# Patient Record
Sex: Female | Born: 1990 | Race: Black or African American | Hispanic: No | Marital: Married | Smoking: Former smoker
Health system: Southern US, Community
[De-identification: ages and names within clinical notes are randomized; demographics above are authoritative.]

## PROBLEM LIST (undated history)

## (undated) HISTORY — PX: OTHER SURGICAL HISTORY: SHX169

## (undated) HISTORY — PX: TONSILLECTOMY: SUR1361

---

## 2011-08-21 ENCOUNTER — Encounter (HOSPITAL_COMMUNITY): Payer: Self-pay | Admitting: Emergency Medicine

## 2011-08-21 ENCOUNTER — Emergency Department (HOSPITAL_COMMUNITY)
Admission: EM | Admit: 2011-08-21 | Discharge: 2011-08-21 | Disposition: A | Discharge status: Home / Self Care | Payer: Self-pay | Attending: Emergency Medicine | Admitting: Emergency Medicine

## 2011-08-21 DIAGNOSIS — Z87891 Personal history of nicotine dependence: Secondary | ICD-10-CM | POA: Insufficient documentation

## 2011-08-21 DIAGNOSIS — R197 Diarrhea, unspecified: Secondary | ICD-10-CM | POA: Insufficient documentation

## 2011-08-21 DIAGNOSIS — R112 Nausea with vomiting, unspecified: Secondary | ICD-10-CM | POA: Insufficient documentation

## 2011-08-21 LAB — COMPREHENSIVE METABOLIC PANEL
Alkaline Phosphatase: 46 U/L (ref 39–117)
BUN: 12 mg/dL (ref 6–23)
CO2: 23 mEq/L (ref 19–32)
Chloride: 99 mEq/L (ref 96–112)
GFR calc Af Amer: >90 mL/min (ref >90)
GFR calc non Af Amer: >90 mL/min (ref >90)
Glucose, Bld: 99 mg/dL (ref 70–99)
Potassium: 3.7 mEq/L (ref 3.5–5.1)
Total Bilirubin: 1.2 mg/dL (ref 0.3–1.2)
Total Protein: 8.6 g/dL — ABNORMAL HIGH (ref 6.0–8.3)

## 2011-08-21 LAB — URINALYSIS, ROUTINE W REFLEX MICROSCOPIC
Bilirubin Urine: NEGATIVE
Glucose, UA: NEGATIVE mg/dL
Ketones, ur: >80 mg/dL — AB
Leukocytes, UA: NEGATIVE
pH: 6 (ref 5.0–8.0)

## 2011-08-21 LAB — CBC WITH DIFFERENTIAL/PLATELET
Eosinophils Absolute: 0 10*3/uL (ref 0.0–0.7)
Hemoglobin: 13.8 g/dL (ref 12.0–15.0)
Lymphs Abs: 1.6 10*3/uL (ref 0.7–4.0)
MCH: 30.9 pg (ref 26.0–34.0)
MCV: 89 fL (ref 78.0–100.0)
Monocytes Relative: 4 % (ref 3–12)
Neutrophils Relative %: 87 % — ABNORMAL HIGH (ref 43–77)
RBC: 4.46 MIL/uL (ref 3.87–5.11)

## 2011-08-21 LAB — LIPASE, BLOOD: Lipase: 26 U/L (ref 11–59)

## 2011-08-21 MED ORDER — FAMOTIDINE IN NACL 20-0.9 MG/50ML-% IV SOLN
20.0000 mg | Freq: Once | INTRAVENOUS | Status: AC
Start: 2011-08-21 — End: 2011-08-21
  Administered 2011-08-21: 20 mg via INTRAVENOUS
  Filled 2011-08-21: qty 50

## 2011-08-21 MED ORDER — ONDANSETRON HCL 4 MG/2ML IJ SOLN
4.0000 mg | INTRAMUSCULAR | Status: DC | PRN
  Administered 2011-08-21: 4 mg via INTRAVENOUS
  Filled 2011-08-21: qty 2

## 2011-08-21 MED ORDER — DICYCLOMINE HCL 10 MG PO CAPS
20.0000 mg | ORAL_CAPSULE | Freq: Once | ORAL | Status: AC
  Administered 2011-08-21: 20 mg via ORAL
  Filled 2011-08-21: qty 2

## 2011-08-21 MED ORDER — SODIUM CHLORIDE 0.9 % IV SOLN: INTRAVENOUS | Status: DC

## 2011-08-21 MED ORDER — SODIUM CHLORIDE 0.9 % IV BOLUS (SEPSIS)
1000.0000 mL | Freq: Once | INTRAVENOUS | Status: AC
  Administered 2011-08-21: 1000 mL via INTRAVENOUS

## 2011-08-21 MED ORDER — ONDANSETRON HCL 4 MG PO TABS: 4.0000 mg | ORAL_TABLET | Freq: Three times a day (TID) | ORAL | Status: AC | PRN

## 2011-08-21 NOTE — Discharge Instructions (Signed)
RESOURCE GUIDE  Chronic Pain Problems: Contact [facility]  [phone] Patients need to be referred by their primary care doctor.  Insufficient Money for Medicine: Contact United Way:  call "211" or [facility] 9842676194.  No Primary Care Doctor: - Call [facility]  [phone] - can help you locate a primary care doctor that  accepts your insurance, provides certain services, etc. - Physician Referral Service- [phone]  Agencies that provide inexpensive medical care: - [facility]  [phone] - [facility]  [phone] - [facility]  [phone] - Women's Clinic  [phone] - Planned Parenthood  [phone] [facility]  [phone]  Medicaid-accepting [facility] Providers: - [facility]- [address]  [phone], Mon-Fri 9am-7pm, Sat 9am-1pm - [facility]- [address] [place], [address] [place]  [phone] - [facility]- [address] [place]  [phone] [facility]- [address]  [phone] - Renaye Rakers- [address] [place], [address], [phone]  Only accepts [place] patients after they have their name  applied to their card  Self Pay (no insurance) in [place]: - Sickle Cell Patients: Dr Willey Blade, [facility]  [address] [place], [phone] - [facility]- [address] [place]  [phone]       [facility] [place]- [address]       -     [facility]- see information above (Speak to [employer] if you do not have insurance)       -  [facility]- [address] [place], [phone]       -  [facility]- [address],  [phone]       -  [facility]- [address], [phone]       -  Dr Julio Sicks-  [address], [place], [phone]       -  [facility]- [address] [phone]       -  [facility]- [address] [phone], also [address] [phone]       -    [facility]- [address] [place], [phone], 1st & 3rd Saturday   every month, 10am-1pm  1) Find a Doctor and Pay Out of Pocket Although you won't have to find out who is covered by your insurance plan, it is a good idea to ask around and get recommendations. You will then need to call the office and see if the doctor you have chosen will accept you as a new patient and what types of options they offer for patients who are self-pay. Some doctors offer discounts or will set up payment plans for their patients who do not have insurance, but you will need to ask so you aren't surprised when you get to your appointment.  2) Contact Your Local Health Department Not all health departments have doctors that can see patients for sick visits, but many do, so it is worth a call to see if yours does. If you don't know where your local health department is, you can check in your phone book. The CDC also has a tool to help you locate your state's health department, and many state websites also have  listings of all of their local health departments.  3) Find a Walk-in Clinic If your illness is not likely to be very severe or complicated, you may want to try a walk in clinic. These are popping up all over the country in pharmacies, drugstores, and shopping centers. They're usually staffed by nurse practitioners or physician assistants that have been trained to treat common illnesses and complaints. They're usually fairly quick and inexpensive. However, if you have serious medical issues or chronic medical problems, these are probably not your best option  STD Testing - [facility] Department of [facility] [place], STD Clinic, [address] [place], phone [phone] or [phone].  Monday - Friday, call for an appointment. [facility]  Department of [employer], STD Clinic, [address], [place], phone [phone] or [phone].  Monday - Friday, call for an appointment.  Abuse/Neglect: [facility] Child Abuse Hotline 202 884 8764 [facility] Child Abuse Hotline 336-063-3643 (After Hours)  Emergency Shelter:  [facility] [phone]  Maternity Homes: - Room at the [place] [facility] [phone] - [facility] [phone]  MRSA Hotline #:   208-399-2520  [facility] Resources  [facility]  United Way [facility] Dept. [address]                 [address]         [address] [place] [address]  Blondell Reveal Phone:  [phone]                                  Phone:  [phone]                   Phone:  [phone]  [facility] Mental Health, [phone] - [facility] - [phone]       -     [facility] in [place], [address]                                  [phone], [facility] Child Abuse Hotline 930-629-5893 or 501-053-3444 (After Hours)   Behavioral Health Services  Substance Abuse Resources: - [facility]  [phone] - [facility] [phone] - [facility] [place] [phone] Floydene Flock [phone] - Residential & Outpatient Substance Abuse Program  8023185481  Psychological Services: [facility]  [phone] Services  [phone] - [facility], [phone] [place]. [address] [place], ACCESS LINE: (657) [phone] or [phone], [employer]  Dental Assistance  If unable to pay or uninsured, contact:  [facility] or [facility]. to become qualified for the adult dental  clinic.  Patients with Medicaid: [facility] [phone] [address], [phone] [address], [phone]  If unable  to pay, or uninsured, contact [facility] [phone]) or [facility] Department [phone] in [place], [phone] in [facility]) to become qualified for the adult dental clinic  Other Low-Cost Community Dental Services: - [facility]- [address] [place], [place], [place], [address], [phone], [phone], 2nd and 4th Thursday of the month at 6:30am.  10 clients each day by appointment, can sometimes see walk-in patients if someone does not show for an appointment. [facility]- [address] [place], [place], [address], [phone] - [facility]- [address], [place], [place], [address], [phone] [facility] Health Department- [phone] [facility] Health Department- [phone] [facility] [phone]     Take the prescription as directed.  Increase your fluid intake (ie:  Gatoraide) for the next few days, as discussed.  Eat a bland diet and advance to your regular diet slowly as you can tolerate it.   Avoid full strength juices, as well as milk and milk products until your diarrhea has resolved.   Call your regular medical doctor Monday to schedule a follow up appointment this week.  Return to the Emergency Department immediately if not improving (or even worsening) despite taking the medicines as prescribed, any black or bloody stool or vomit, if you develop a fever over "101," or for any other concerns.

## 2011-08-21 NOTE — ED Provider Notes (Signed)
History     CSN: [id number]  Arrival date & time 08/21/11  1539   First MD Initiated Contact with Patient 08/21/11 1614      Chief Complaint  Patient presents with  . Emesis  . Diarrhea    HPI Pt was seen at 1630.  Per pt, c/o gradual onset and persistence of multiple intermittent episodes of N/V/D since last night.  Describes the diarrhea as "watery."  Has been associated with generalized "cramping" abd pain.  Denies black or blood in stools or emesis, no back pain, no CP/SOB, no fevers, no rash.    History reviewed. No pertinent past medical history.  Past Surgical History  Procedure Date  . Tonsillectomy   . Addenoidectomy     History  Substance Use Topics  . Smoking status: Former Games developer  . Smokeless tobacco: Not on file  . Alcohol Use: No    Review of Systems ROS: Statement: All systems negative except as marked or noted in the HPI; Constitutional: Negative for fever and chills. ; ; Eyes: Negative for eye pain, redness and discharge. ; ; ENMT: Negative for ear pain, hoarseness, nasal congestion, sinus pressure and sore throat. ; ; Cardiovascular: Negative for chest pain, palpitations, diaphoresis, dyspnea and peripheral edema. ; ; Respiratory: Negative for cough, wheezing and stridor. ; ; Gastrointestinal: +N/V/D, abd "cramping." Negative for abdominal pain, blood in stool, hematemesis, jaundice and rectal bleeding. . ; ; Genitourinary: Negative for dysuria, flank pain and hematuria. ; ; Musculoskeletal: Negative for back pain and neck pain. Negative for swelling and trauma.; ; Skin: Negative for pruritus, rash, abrasions, blisters, bruising and skin lesion.; ; Neuro: Negative for headache, lightheadedness and neck stiffness. Negative for weakness, altered level of consciousness , altered mental status, extremity weakness, paresthesias, involuntary movement, seizure and syncope.       Allergies  Review of patient's allergies indicates no known allergies.  Home  Medications   Current Outpatient Rx  Name Route Sig Dispense Refill  . UNKNOWN TO PATIENT Oral Take 1 tablet by mouth daily. *Birth Control* (NAME UNKNOWN)      BP 133/70  Pulse 69  Temp 98.7 F (37.1 C) (Oral)  Resp 20  Ht 5\' 7"  (1.702 m)  Wt 170 lb (77.111 kg)  BMI 26.63 kg/m2  SpO2 100%  LMP 06/21/2011  Physical Exam 1635: Physical examination:  Nursing notes reviewed; Vital signs and O2 SAT reviewed;  Constitutional: Well developed, Well nourished, Well hydrated, In no acute distress; Head:  Normocephalic, atraumatic; Eyes: EOMI, PERRL, No scleral icterus; ENMT: Mouth and pharynx normal, Mucous membranes moist; Neck: Supple, Full range of motion, No lymphadenopathy; Cardiovascular: Regular rate and rhythm, No murmur, rub, or gallop; Respiratory: Breath sounds clear & equal bilaterally, No rales, rhonchi, wheezes.  Speaking full sentences with ease, Normal respiratory effort/excursion; Chest: Nontender, Movement normal; Abdomen: Soft, +mild mid-epigastric tenderness to palp.  No rebound or guarding. Nondistended, Normal bowel sounds;; Extremities: Pulses normal, No tenderness, No edema, No calf edema or asymmetry.; Neuro: AA&Ox3, Major CN grossly intact.  Speech clear. No gross focal motor or sensory deficits in extremities.; Skin: Color normal, Warm, Dry.   ED Course  Procedures   MDM  MDM Reviewed: nursing note and vitals Interpretation: labs     Results for orders placed during the hospital encounter of 08/21/11  URINALYSIS, ROUTINE W REFLEX MICROSCOPIC      Component Value Range   Color, Urine YELLOW  YELLOW   APPearance CLOUDY (*) CLEAR   Specific Gravity, Urine >  1.030 (*) 1.005 - 1.030   pH 6.0  5.0 - 8.0   Glucose, UA NEGATIVE  NEGATIVE mg/dL   Hgb urine dipstick TRACE (*) NEGATIVE   Bilirubin Urine NEGATIVE  NEGATIVE   Ketones, ur >80 (*) NEGATIVE mg/dL   Protein, ur 841 (*) NEGATIVE mg/dL   Urobilinogen, UA 0.2  0.0 - 1.0 mg/dL   Nitrite NEGATIVE  NEGATIVE    Leukocytes, UA NEGATIVE  NEGATIVE  PREGNANCY, URINE      Component Value Range   Preg Test, Ur NEGATIVE  NEGATIVE  CBC WITH DIFFERENTIAL      Component Value Range   WBC 17.1 (*) 4.0 - 10.5 K/uL   RBC 4.46  3.87 - 5.11 MIL/uL   Hemoglobin 13.8  12.0 - 15.0 g/dL   HCT 32.4  40.1 - 02.7 %   MCV 89.0  78.0 - 100.0 fL   MCH 30.9  26.0 - 34.0 pg   MCHC 34.8  30.0 - 36.0 g/dL   RDW 25.3  66.4 - 40.3 %   Platelets 287  150 - 400 K/uL   Neutrophils Relative 87 (*) 43 - 77 %   Neutro Abs 14.8 (*) 1.7 - 7.7 K/uL   Lymphocytes Relative 10 (*) 12 - 46 %   Lymphs Abs 1.6  0.7 - 4.0 K/uL   Monocytes Relative 4  3 - 12 %   Monocytes Absolute 0.7  0.1 - 1.0 K/uL   Eosinophils Relative 0  0 - 5 %   Eosinophils Absolute 0.0  0.0 - 0.7 K/uL   Basophils Relative 0  0 - 1 %   Basophils Absolute 0.0  0.0 - 0.1 K/uL  COMPREHENSIVE METABOLIC PANEL      Component Value Range   Sodium 137  135 - 145 mEq/L   Potassium 3.7  3.5 - 5.1 mEq/L   Chloride 99  96 - 112 mEq/L   CO2 23  19 - 32 mEq/L   Glucose, Bld 99  70 - 99 mg/dL   BUN 12  6 - 23 mg/dL   Creatinine, Ser 4.74  0.50 - 1.10 mg/dL   Calcium 25.9  8.4 - 56.3 mg/dL   Total Protein 8.6 (*) 6.0 - 8.3 g/dL   Albumin 5.0  3.5 - 5.2 g/dL   AST 37  0 - 37 U/L   ALT 33  0 - 35 U/L   Alkaline Phosphatase 46  39 - 117 U/L   Total Bilirubin 1.2  0.3 - 1.2 mg/dL   GFR calc non Af Amer >90  >90 mL/min   GFR calc Af Amer >90  >90 mL/min  LIPASE, BLOOD      Component Value Range   Lipase 26  11 - 59 U/L  URINE MICROSCOPIC-ADD ON      Component Value Range   WBC, UA 3-6  <3 WBC/hpf   RBC / HPF 3-6  <3 RBC/hpf   Bacteria, UA FEW (*) RARE   Casts GRANULAR CAST (*) NEGATIVE      1944:  Pt states she "feels better now" and wants to go home.  Has tol PO well while in the ED without N/V.  No stooling while in the ED.  VSS, afebrile, resps easy, abd benign.  Dx testing d/w pt.  Questions answered.  Verb understanding, agreeable to d/c home with outpt  f/u.       Laray Anger, DO 08/22/11 [phone]

## 2011-08-21 NOTE — ED Notes (Signed)
N/v/d since last night with all over abd. Last normal bm x 1 day. Mm wet. No s/s of dehydration. Denies gu sx's.

## 2011-08-22 ENCOUNTER — Emergency Department (HOSPITAL_COMMUNITY)
Admission: EM | Admit: 2011-08-22 | Discharge: 2011-08-22 | Disposition: A | Discharge status: Home / Self Care | Payer: Self-pay | Attending: Emergency Medicine | Admitting: Emergency Medicine

## 2011-08-22 ENCOUNTER — Emergency Department (HOSPITAL_COMMUNITY): Payer: Self-pay

## 2011-08-22 ENCOUNTER — Encounter (HOSPITAL_COMMUNITY): Payer: Self-pay | Admitting: Emergency Medicine

## 2011-08-22 DIAGNOSIS — R109 Unspecified abdominal pain: Secondary | ICD-10-CM | POA: Insufficient documentation

## 2011-08-22 DIAGNOSIS — R197 Diarrhea, unspecified: Secondary | ICD-10-CM | POA: Insufficient documentation

## 2011-08-22 DIAGNOSIS — R10819 Abdominal tenderness, unspecified site: Secondary | ICD-10-CM | POA: Insufficient documentation

## 2011-08-22 DIAGNOSIS — A499 Bacterial infection, unspecified: Secondary | ICD-10-CM | POA: Insufficient documentation

## 2011-08-22 DIAGNOSIS — N760 Acute vaginitis: Secondary | ICD-10-CM | POA: Insufficient documentation

## 2011-08-22 DIAGNOSIS — R112 Nausea with vomiting, unspecified: Secondary | ICD-10-CM | POA: Insufficient documentation

## 2011-08-22 DIAGNOSIS — B9689 Other specified bacterial agents as the cause of diseases classified elsewhere: Secondary | ICD-10-CM | POA: Insufficient documentation

## 2011-08-22 LAB — CBC WITH DIFFERENTIAL/PLATELET
Eosinophils Relative: 0 % (ref 0–5)
HCT: 40.7 % (ref 36.0–46.0)
Lymphocytes Relative: 16 % (ref 12–46)
Lymphs Abs: 2.5 10*3/uL (ref 0.7–4.0)
MCV: 89.1 fL (ref 78.0–100.0)
Monocytes Absolute: 0.8 10*3/uL (ref 0.1–1.0)
Neutro Abs: 12.1 10*3/uL — ABNORMAL HIGH (ref 1.7–7.7)
RBC: 4.57 MIL/uL (ref 3.87–5.11)
WBC: 15.4 10*3/uL — ABNORMAL HIGH (ref 4.0–10.5)

## 2011-08-22 LAB — BASIC METABOLIC PANEL
CO2: 21 mEq/L (ref 19–32)
Calcium: 10.2 mg/dL (ref 8.4–10.5)
Chloride: 102 mEq/L (ref 96–112)
Creatinine, Ser: 0.91 mg/dL (ref 0.50–1.10)
Glucose, Bld: 94 mg/dL (ref 70–99)
Sodium: 140 mEq/L (ref 135–145)

## 2011-08-22 LAB — OCCULT BLOOD, POC DEVICE: Fecal Occult Bld: NEGATIVE

## 2011-08-22 LAB — URINALYSIS, ROUTINE W REFLEX MICROSCOPIC
Glucose, UA: NEGATIVE mg/dL
Specific Gravity, Urine: >1.03 — ABNORMAL HIGH (ref 1.005–1.030)
pH: 6 (ref 5.0–8.0)

## 2011-08-22 LAB — PREGNANCY, URINE: Preg Test, Ur: NEGATIVE

## 2011-08-22 LAB — URINE MICROSCOPIC-ADD ON

## 2011-08-22 LAB — WET PREP, GENITAL: Yeast Wet Prep HPF POC: NONE SEEN

## 2011-08-22 MED ORDER — MORPHINE SULFATE 4 MG/ML IJ SOLN
4.0000 mg | Freq: Once | INTRAMUSCULAR | Status: AC
  Administered 2011-08-22: 4 mg via INTRAVENOUS
  Filled 2011-08-22: qty 1

## 2011-08-22 MED ORDER — KETOROLAC TROMETHAMINE 30 MG/ML IJ SOLN
30.0000 mg | Freq: Once | INTRAMUSCULAR | Status: AC
  Administered 2011-08-22: 30 mg via INTRAVENOUS
  Filled 2011-08-22: qty 1

## 2011-08-22 MED ORDER — ONDANSETRON HCL 4 MG/2ML IJ SOLN
INTRAMUSCULAR | Status: AC
  Administered 2011-08-22: 4 mg
  Filled 2011-08-22: qty 2

## 2011-08-22 MED ORDER — METRONIDAZOLE 500 MG PO TABS
2000.0000 mg | ORAL_TABLET | Freq: Once | ORAL | Status: AC
  Administered 2011-08-22: 2000 mg via ORAL
  Filled 2011-08-22: qty 4

## 2011-08-22 MED ORDER — IOHEXOL 300 MG/ML  SOLN
100.0000 mL | Freq: Once | INTRAMUSCULAR | Status: AC | PRN
  Administered 2011-08-22: 100 mL via INTRAVENOUS

## 2011-08-22 MED ORDER — SODIUM CHLORIDE 0.9 % IV BOLUS (SEPSIS)
1000.0000 mL | Freq: Once | INTRAVENOUS | Status: AC
  Administered 2011-08-22: 1000 mL via INTRAVENOUS

## 2011-08-22 MED ORDER — ONDANSETRON HCL 4 MG/2ML IJ SOLN
4.0000 mg | Freq: Once | INTRAMUSCULAR | Status: AC
  Administered 2011-08-22: 4 mg via INTRAVENOUS
  Filled 2011-08-22: qty 2

## 2011-08-22 NOTE — ED Notes (Signed)
Pt requesting more pain med. edp aware.  

## 2011-08-22 NOTE — ED Notes (Signed)
Pt resting better at this time.

## 2011-08-22 NOTE — ED Provider Notes (Signed)
History     CSN: [id number]  Arrival date & time 08/22/11  1546   First MD Initiated Contact with Patient 08/22/11 [phone]      Chief Complaint  Patient presents with  . Abdominal Pain    (Consider location/radiation/quality/duration/timing/severity/associated sxs/prior treatment) HPI Comments: Patient seen yesterday for nausea, vomiting and diarrhea. She returns today with increasing abdominal pain. She states she had 5-6 episodes of nonbloody nonbilious emesis today. She's had frequent loose stools as well that appeared to be black. She took Zofran today at 12:00 and then felt worse. Her pain is all over and generalized and crampy. Sick contacts, fevers, rash, back pain, chest pain or shortness of breath  The history is provided by the patient.    History reviewed. No pertinent past medical history.  Past Surgical History  Procedure Date  . Tonsillectomy   . Addenoidectomy     History reviewed. No pertinent family history.  History  Substance Use Topics  . Smoking status: Former Games developer  . Smokeless tobacco: Not on file  . Alcohol Use: No    OB History    Grav Para Term Preterm Abortions TAB SAB Ect Mult Living                  Review of Systems  Constitutional: Positive for appetite change. Negative for fever and fatigue.  HENT: Negative for congestion and rhinorrhea.   Respiratory: Negative for cough, chest tightness and shortness of breath.   Cardiovascular: Negative for chest pain.  Gastrointestinal: Positive for nausea, vomiting, abdominal pain and diarrhea.  Genitourinary: Negative for dysuria, hematuria, vaginal bleeding and vaginal discharge.  Musculoskeletal: Negative for back pain.  Skin: Negative for rash.  Neurological: Negative for headaches.    Allergies  Review of patient's allergies indicates no known allergies.  Home Medications   Current Outpatient Rx  Name Route Sig Dispense Refill  . ONDANSETRON HCL 4 MG PO TABS Oral Take 1 tablet (4 mg  total) by mouth every 8 (eight) hours as needed for nausea. 6 tablet 0    BP 117/68  Pulse 56  Temp 97.7 F (36.5 C) (Oral)  Resp 18  SpO2 100%  LMP 06/21/2011  Physical Exam  Constitutional: She is oriented to person, place, and time. She appears well-developed and well-nourished. No distress.       Uncomfortable  HENT:  Head: Normocephalic and atraumatic.  Mouth/Throat: Oropharynx is clear and moist. No oropharyngeal exudate.  Eyes: Conjunctivae and EOM are normal. Pupils are equal, round, and reactive to light.  Neck: Normal range of motion. Neck supple.  Cardiovascular: Normal rate, regular rhythm and normal heart sounds.   No murmur heard. Pulmonary/Chest: Effort normal and breath sounds normal. No respiratory distress.  Abdominal: Soft. There is tenderness. There is no rebound and no guarding.       Mild diffuse tenderness, no pain at McBurney's point no guarding or rebound  Genitourinary: There is no tenderness on the right labia. There is no tenderness on the left labia. Cervix exhibits no motion tenderness. Right adnexum displays no tenderness. Left adnexum displays no tenderness. No vaginal discharge found.  Musculoskeletal: Normal range of motion. She exhibits no edema and no tenderness.  Neurological: She is alert and oriented to person, place, and time. No cranial nerve deficit.  Skin: Skin is warm.    ED Course  Procedures (including critical care time)  Labs Reviewed  URINALYSIS, ROUTINE W REFLEX MICROSCOPIC - Abnormal; Notable for the following:    Color,  Urine AMBER (*)  BIOCHEMICALS MAY BE AFFECTED BY COLOR   Specific Gravity, Urine >1.030 (*)     Hgb urine dipstick TRACE (*)     Bilirubin Urine SMALL (*)     Ketones, ur >80 (*)     All other components within normal limits  CBC WITH DIFFERENTIAL - Abnormal; Notable for the following:    WBC 15.4 (*)     Neutrophils Relative 79 (*)     Neutro Abs 12.1 (*)     All other components within normal limits    BASIC METABOLIC PANEL - Abnormal; Notable for the following:    Potassium 3.3 (*)     All other components within normal limits  WET PREP, GENITAL - Abnormal; Notable for the following:    Clue Cells Wet Prep HPF POC FEW (*)     WBC, Wet Prep HPF POC FEW (*)     All other components within normal limits  URINE MICROSCOPIC-ADD ON - Abnormal; Notable for the following:    Squamous Epithelial / LPF MANY (*)     Bacteria, UA FEW (*)     All other components within normal limits  PREGNANCY, URINE  OCCULT BLOOD, POC DEVICE  GC/CHLAMYDIA PROBE AMP, GENITAL   Ct Abdomen Pelvis W Contrast  08/22/2011  *RADIOLOGY REPORT*  Clinical Data: Abdominal back pain, diarrhea, vomiting  CT ABDOMEN AND PELVIS WITH CONTRAST  Technique:  Multidetector CT imaging of the abdomen and pelvis was performed following the standard protocol during bolus administration of intravenous contrast.  Contrast: OMNIPAQUE IOHEXOL 300 MG/ML  SOLN  Comparison: None.  Findings: The lung bases are clear.  Heart size normal.  No pericardial or pleural effusion.  Negative for hiatal hernia.  Abdomen:  Liver, gallbladder, biliary system, pancreas, spleen, adrenal glands, kidneys demonstrate no acute process and are within normal limits for age.  Negative for bowel obstruction, dilatation, ileus, or free air.  Colon is collapsed.  No abdominal free fluid, fluid collection, hemorrhage, hematoma, or adenopathy.  Pelvis:  Bilateral adnexal ovarian cysts noted, measuring 2 x 3.3 cm on the right and 5 x 2.3 cm on the left.  Trace pelvic free fluid on the right, likely physiologic.  Normal retrocecal appendix demonstrated.  No pelvic fluid collection, hemorrhage, adenopathy, inguinal abnormality, or hernia.  No acute abnormal osseous finding.  IMPRESSION: No acute intra-abdominal or pelvic process.  Bilateral ovarian cysts  Normal appendix  Original Report Authenticated By: Judie Petit. Ruel Favors, M.D.     1. Nausea vomiting and diarrhea   2.  Bacterial vaginosis       MDM  Nausea, vomiting, diarrhea, abdominal pain, seen yesterday for same. Vital stable, no distress Abdomen soft and nonsurgical.  Urinalysis negative but positive for ketones. White count improved from yesterday. Pelvic exam benign. Pain improved with medications.  Patient pain improved medications. Abdomen is soft and nontender. We'll treat bacterial vaginosis.     Glynn Octave, MD 08/22/11 2126

## 2011-08-22 NOTE — ED Notes (Signed)
Pt went to bathroom for urine sample and vomited. edp aware

## 2011-08-22 NOTE — ED Notes (Signed)
Here yesterday for abd pain. Back today due to pain back again. Pain all over abd. Last normal bm yesterday. States had diarrhea x 1 today, states came out black but wiped brown. Vomiting x 5. Took zofran today at 52. Pt slightly restless.

## 2011-08-24 LAB — GC/CHLAMYDIA PROBE AMP, GENITAL: GC Probe Amp, Genital: NEGATIVE

## 2013-08-10 ENCOUNTER — Emergency Department (HOSPITAL_COMMUNITY): Payer: Self-pay

## 2013-08-10 ENCOUNTER — Encounter (HOSPITAL_COMMUNITY): Payer: Self-pay | Admitting: Emergency Medicine

## 2013-08-10 ENCOUNTER — Emergency Department (HOSPITAL_COMMUNITY)
Admission: EM | Admit: 2013-08-10 | Discharge: 2013-08-10 | Disposition: A | Discharge status: Home / Self Care | Payer: Self-pay | Attending: Emergency Medicine | Admitting: Emergency Medicine

## 2013-08-10 DIAGNOSIS — Z87891 Personal history of nicotine dependence: Secondary | ICD-10-CM | POA: Insufficient documentation

## 2013-08-10 DIAGNOSIS — Z3202 Encounter for pregnancy test, result negative: Secondary | ICD-10-CM | POA: Insufficient documentation

## 2013-08-10 DIAGNOSIS — N946 Dysmenorrhea, unspecified: Secondary | ICD-10-CM | POA: Insufficient documentation

## 2013-08-10 DIAGNOSIS — R109 Unspecified abdominal pain: Secondary | ICD-10-CM | POA: Insufficient documentation

## 2013-08-10 DIAGNOSIS — N760 Acute vaginitis: Secondary | ICD-10-CM | POA: Insufficient documentation

## 2013-08-10 DIAGNOSIS — B9689 Other specified bacterial agents as the cause of diseases classified elsewhere: Secondary | ICD-10-CM | POA: Insufficient documentation

## 2013-08-10 DIAGNOSIS — A499 Bacterial infection, unspecified: Secondary | ICD-10-CM | POA: Insufficient documentation

## 2013-08-10 DIAGNOSIS — R112 Nausea with vomiting, unspecified: Secondary | ICD-10-CM | POA: Insufficient documentation

## 2013-08-10 LAB — CBC WITH DIFFERENTIAL/PLATELET
BASOS ABS: 0 10*3/uL (ref 0.0–0.1)
BASOS PCT: 0 % (ref 0–1)
EOS ABS: 0 10*3/uL (ref 0.0–0.7)
Eosinophils Relative: 0 % (ref 0–5)
HCT: 38.7 % (ref 36.0–46.0)
HEMOGLOBIN: 13.1 g/dL (ref 12.0–15.0)
Lymphocytes Relative: 11 % — ABNORMAL LOW (ref 12–46)
Lymphs Abs: 2.1 10*3/uL (ref 0.7–4.0)
MCH: 30.5 pg (ref 26.0–34.0)
MCHC: 33.9 g/dL (ref 30.0–36.0)
MCV: 90 fL (ref 78.0–100.0)
MONOS PCT: 5 % (ref 3–12)
Monocytes Absolute: 1 10*3/uL (ref 0.1–1.0)
NEUTROS ABS: 16.4 10*3/uL — AB (ref 1.7–7.7)
NEUTROS PCT: 84 % — AB (ref 43–77)
PLATELETS: 340 10*3/uL (ref 150–400)
RBC: 4.3 MIL/uL (ref 3.87–5.11)
RDW: 12.8 % (ref 11.5–15.5)
WBC: 19.5 10*3/uL — ABNORMAL HIGH (ref 4.0–10.5)

## 2013-08-10 LAB — URINALYSIS, ROUTINE W REFLEX MICROSCOPIC
Bilirubin Urine: NEGATIVE
Glucose, UA: NEGATIVE mg/dL
KETONES UR: 40 mg/dL — AB
LEUKOCYTES UA: NEGATIVE
NITRITE: NEGATIVE
PROTEIN: 100 mg/dL — AB
Specific Gravity, Urine: 1.01 (ref 1.005–1.030)
UROBILINOGEN UA: 0.2 mg/dL (ref 0.0–1.0)
pH: >9 — ABNORMAL HIGH (ref 5.0–8.0)

## 2013-08-10 LAB — COMPREHENSIVE METABOLIC PANEL
ALBUMIN: 4.4 g/dL (ref 3.5–5.2)
ALK PHOS: 51 U/L (ref 39–117)
ALT: 19 U/L (ref 0–35)
ANION GAP: 16 — AB (ref 5–15)
AST: 25 U/L (ref 0–37)
BUN: 12 mg/dL (ref 6–23)
CO2: 22 mEq/L (ref 19–32)
Calcium: 9.6 mg/dL (ref 8.4–10.5)
Chloride: 101 mEq/L (ref 96–112)
Creatinine, Ser: 0.74 mg/dL (ref 0.50–1.10)
GFR calc Af Amer: >90 mL/min (ref >90)
GFR calc non Af Amer: >90 mL/min (ref >90)
Glucose, Bld: 117 mg/dL — ABNORMAL HIGH (ref 70–99)
POTASSIUM: 3.6 meq/L — AB (ref 3.7–5.3)
Sodium: 139 mEq/L (ref 137–147)
TOTAL PROTEIN: 7.9 g/dL (ref 6.0–8.3)
Total Bilirubin: 0.7 mg/dL (ref 0.3–1.2)

## 2013-08-10 LAB — WET PREP, GENITAL
Trich, Wet Prep: NONE SEEN
WBC, Wet Prep HPF POC: NONE SEEN
YEAST WET PREP: NONE SEEN

## 2013-08-10 LAB — PREGNANCY, URINE: PREG TEST UR: NEGATIVE

## 2013-08-10 LAB — URINE MICROSCOPIC-ADD ON

## 2013-08-10 LAB — LIPASE, BLOOD: LIPASE: 29 U/L (ref 11–59)

## 2013-08-10 MED ORDER — ONDANSETRON HCL 4 MG PO TABS: 4.0000 mg | ORAL_TABLET | Freq: Three times a day (TID) | ORAL | Status: DC | PRN

## 2013-08-10 MED ORDER — FAMOTIDINE 20 MG PO TABS
40.0000 mg | ORAL_TABLET | Freq: Once | ORAL | Status: AC
  Administered 2013-08-10: 40 mg via ORAL
  Filled 2013-08-10: qty 2

## 2013-08-10 MED ORDER — METRONIDAZOLE 500 MG PO TABS: 500.0000 mg | ORAL_TABLET | Freq: Two times a day (BID) | ORAL | Status: DC

## 2013-08-10 MED ORDER — NAPROXEN 250 MG PO TABS: 250.0000 mg | ORAL_TABLET | Freq: Two times a day (BID) | ORAL | Status: DC

## 2013-08-10 MED ORDER — ONDANSETRON 8 MG PO TBDP
8.0000 mg | ORAL_TABLET | Freq: Once | ORAL | Status: AC
  Administered 2013-08-10: 8 mg via ORAL
  Filled 2013-08-10: qty 1

## 2013-08-10 MED ORDER — OXYCODONE-ACETAMINOPHEN 5-325 MG PO TABS
1.0000 | ORAL_TABLET | Freq: Once | ORAL | Status: AC
  Administered 2013-08-10: 1 via ORAL
  Filled 2013-08-10: qty 1

## 2013-08-10 MED ORDER — KETOROLAC TROMETHAMINE 60 MG/2ML IM SOLN
60.0000 mg | Freq: Once | INTRAMUSCULAR | Status: AC
  Administered 2013-08-10: 60 mg via INTRAMUSCULAR
  Filled 2013-08-10: qty 2

## 2013-08-10 NOTE — ED Provider Notes (Signed)
CSN: [id number]     Arrival date & time 08/10/13  1241 History   First MD Initiated Contact with Patient 08/10/13 1444     Chief Complaint  Patient presents with  . Emesis  . Abdominal Pain      HPI Pt was seen at 1455.  Per EMS and pt report, c/o gradual onset and persistence of multiple intermittent episodes of N/V that began this morning. Pt states she "had a lot to drink last night." Pt was ambulatory on scene per EMS. Pt also c/o "I got my period today" with pelvic "cramping." States she did not go to work due to her symptoms. Denies abd pain, no CP/SOB, no back pain, no fevers, no black or blood in stools or emesis, no vaginal discharge, no dysuria.    History reviewed. No pertinent past medical history.  Past Surgical History  Procedure Laterality Date  . Tonsillectomy    . Addenoidectomy      History  Substance Use Topics  . Smoking status: Former Games developermoker  . Smokeless tobacco: Not on file  . Alcohol Use: No    Review of Systems ROS: Statement: All systems negative except as marked or noted in the HPI; Constitutional: Negative for fever and chills. ; ; Eyes: Negative for eye pain, redness and discharge. ; ; ENMT: Negative for ear pain, hoarseness, nasal congestion, sinus pressure and sore throat. ; ; Cardiovascular: Negative for chest pain, palpitations, diaphoresis, dyspnea and peripheral edema. ; ; Respiratory: Negative for cough, wheezing and stridor. ; ; Gastrointestinal: +N/V, abd pain. Negative for diarrhea, blood in stool, hematemesis, jaundice and rectal bleeding. . ; ; Genitourinary: Negative for dysuria, flank pain and hematuria. ; ; GYN:  +vaginal bleeding (menses). No vaginal discharge, no vulvar pain.  Musculoskeletal: Negative for back pain and neck pain. Negative for swelling and trauma.; ; Skin: Negative for pruritus, rash, abrasions, blisters, bruising and skin lesion.; ; Neuro: Negative for headache, lightheadedness and neck stiffness. Negative for weakness,  altered level of consciousness , altered mental status, extremity weakness, paresthesias, involuntary movement, seizure and syncope.      Allergies  Review of patient's allergies indicates no known allergies.  Home Medications   Prior to Admission medications   Medication Sig Start Date End Date Taking? Authorizing Provider  acetaminophen (TYLENOL) 500 MG tablet Take 500-1,000 mg by mouth every 6 (six) hours as needed for mild pain or moderate pain.   Yes Historical Provider, MD   BP 132/90  Pulse 54  Temp(Src) 98 F (36.7 C) (Oral)  Resp 20  SpO2 100%  LMP 08/10/2013 Physical Exam 1500: Physical examination:  Nursing notes reviewed; Vital signs and O2 SAT reviewed;  Constitutional: Well developed, Well nourished, Well hydrated, In no acute distress; Head:  Normocephalic, atraumatic; Eyes: EOMI, PERRL, No scleral icterus; ENMT: Mouth and pharynx normal, Mucous membranes moist; Neck: Supple, Full range of motion, No lymphadenopathy; Cardiovascular: Regular rate and rhythm, No murmur, rub, or gallop; Respiratory: Breath sounds clear & equal bilaterally, No rales, rhonchi, wheezes.  Speaking full sentences with ease, Normal respiratory effort/excursion; Chest: Nontender, Movement normal; Abdomen: Pt was gagging and retching loudly right before my exam. Soft, +very mild mid-epigastric tenderness to palp. No rebound or guarding. Nondistended, Normal bowel sounds; Genitourinary: No CVA tenderness; Extremities: Pulses normal, No tenderness, No edema, No calf edema or asymmetry.; Neuro: AA&Ox3, Major CN grossly intact.  Speech clear. No gross focal motor or sensory deficits in extremities.; Skin: Color normal, Warm, Dry.   ED Course  Procedures     MDM  MDM Reviewed: previous chart, nursing note and vitals Reviewed previous: labs Interpretation: labs and CT scan    Results for orders placed during the hospital encounter of 08/10/13  WET PREP, GENITAL      Result Value Ref Range    Yeast Wet Prep HPF POC NONE SEEN  NONE SEEN   Trich, Wet Prep NONE SEEN  NONE SEEN   Clue Cells Wet Prep HPF POC MODERATE (*) NONE SEEN   WBC, Wet Prep HPF POC NONE SEEN  NONE SEEN  URINALYSIS, ROUTINE W REFLEX MICROSCOPIC      Result Value Ref Range   Color, Urine YELLOW  YELLOW   APPearance HAZY (*) CLEAR   Specific Gravity, Urine 1.010  1.005 - 1.030   pH >9.0 (*) 5.0 - 8.0   Glucose, UA NEGATIVE  NEGATIVE mg/dL   Hgb urine dipstick LARGE (*) NEGATIVE   Bilirubin Urine NEGATIVE  NEGATIVE   Ketones, ur 40 (*) NEGATIVE mg/dL   Protein, ur 161 (*) NEGATIVE mg/dL   Urobilinogen, UA 0.2  0.0 - 1.0 mg/dL   Nitrite NEGATIVE  NEGATIVE   Leukocytes, UA NEGATIVE  NEGATIVE  PREGNANCY, URINE      Result Value Ref Range   Preg Test, Ur NEGATIVE  NEGATIVE  CBC WITH DIFFERENTIAL      Result Value Ref Range   WBC 19.5 (*) 4.0 - 10.5 K/uL   RBC 4.30  3.87 - 5.11 MIL/uL   Hemoglobin 13.1  12.0 - 15.0 g/dL   HCT 09.6  04.5 - 40.9 %   MCV 90.0  78.0 - 100.0 fL   MCH 30.5  26.0 - 34.0 pg   MCHC 33.9  30.0 - 36.0 g/dL   RDW 81.1  91.4 - 78.2 %   Platelets 340  150 - 400 K/uL   Neutrophils Relative % 84 (*) 43 - 77 %   Neutro Abs 16.4 (*) 1.7 - 7.7 K/uL   Lymphocytes Relative 11 (*) 12 - 46 %   Lymphs Abs 2.1  0.7 - 4.0 K/uL   Monocytes Relative 5  3 - 12 %   Monocytes Absolute 1.0  0.1 - 1.0 K/uL   Eosinophils Relative 0  0 - 5 %   Eosinophils Absolute 0.0  0.0 - 0.7 K/uL   Basophils Relative 0  0 - 1 %   Basophils Absolute 0.0  0.0 - 0.1 K/uL  COMPREHENSIVE METABOLIC PANEL      Result Value Ref Range   Sodium 139  137 - 147 mEq/L   Potassium 3.6 (*) 3.7 - 5.3 mEq/L   Chloride 101  96 - 112 mEq/L   CO2 22  19 - 32 mEq/L   Glucose, Bld 117 (*) 70 - 99 mg/dL   BUN 12  6 - 23 mg/dL   Creatinine, Ser 9.56  0.50 - 1.10 mg/dL   Calcium 9.6  8.4 - 21.3 mg/dL   Total Protein 7.9  6.0 - 8.3 g/dL   Albumin 4.4  3.5 - 5.2 g/dL   AST 25  0 - 37 U/L   ALT 19  0 - 35 U/L   Alkaline Phosphatase  51  39 - 117 U/L   Total Bilirubin 0.7  0.3 - 1.2 mg/dL   GFR calc non Af Amer >90  >90 mL/min   GFR calc Af Amer >90  >90 mL/min   Anion gap 16 (*) 5 - 15  LIPASE, BLOOD  Result Value Ref Range   Lipase 29  11 - 59 U/L  URINE MICROSCOPIC-ADD ON      Result Value Ref Range   WBC, UA 0-2  <3 WBC/hpf   RBC / HPF 11-20  <3 RBC/hpf   Bacteria, UA FEW (*) RARE    Ct Abdomen Pelvis Wo Contrast 08/10/2013   CLINICAL DATA:  Nausea vomiting diarrhea started this morning, currently on menstrual cycle would also started this morning  EXAM: CT ABDOMEN AND PELVIS WITHOUT CONTRAST  TECHNIQUE: Multidetector CT imaging of the abdomen and pelvis was performed following the standard protocol without IV contrast.  COMPARISON:  08/22/2011  FINDINGS: Visualized lung bases clear.  No acute musculoskeletal findings.  Liver, gallbladder, spleen, pancreas, and kidneys are normal. Adrenal glands are normal.  Aorta is normal. Stomach, small bowel, large bowel, and appendix are normal.  Uterus and left ovary normal. Cannot distinguish possible right ovarian cyst from a loop of bowel. Bladder is normal. There is no free fluid or adenopathy.  IMPRESSION: Cannot distinguish possibility of right ovarian cyst from bowel loops given absence of IV and oral contrast. Consider pelvic ultrasound.   Electronically Signed   By: Esperanza Heir M.D.   On: 08/10/2013 18:49     1900:  Pt has tol PO well without further gagging while in the ED. No stooling while in the ED. Pt sleeping soundly most of her ED visit, arousable easily to her name and states "none of the medicines are working." When informed of her dx testing results above, pt then stated she was having pelvic pain, esp right pelvis. Abd exam now without mid-epigastric tenderness but pt c/o right pelvic and suprapubic tenderness to palp. Will perform pelvic exam and US pelvis to r/o possible ovarian cyst per CT scan.     [place] Art/ven Flow Abd Pelv Doppler 08/10/2013    CLINICAL DATA:  Pelvic pain, concern for possible for vision  EXAM: TRANSABDOMINAL AND TRANSVAGINAL ULTRASOUND OF PELVIS  DOPPLER ULTRASOUND OF OVARIES  TECHNIQUE: Both transabdominal and transvaginal ultrasound examinations of the pelvis were performed. Transabdominal technique was performed for global imaging of the pelvis including uterus, ovaries, adnexal regions, and pelvic cul-de-sac.  It was necessary to proceed with endovaginal exam following the transabdominal exam to visualize the ovaries. Color and duplex Doppler ultrasound was utilized to evaluate blood flow to the ovaries.  COMPARISON:  None.  FINDINGS: Uterus  Measurements: 6.5 x 3.0 x 4.0 cm. No fibroids or other mass visualized.  Endometrium  Thickness: 2 mm.  No focal abnormality visualized.  Right ovary  Measurements: 44 x 26 x 24 mm. Normal appearance/no adnexal mass.  Left ovary  Measurements: 38 x 23 x 20 mm. Normal appearance/no adnexal mass.  Pulsed Doppler evaluation of both ovaries demonstrates normal low-resistance arterial and venous waveforms.  Other findings  No free fluid.  IMPRESSION: Normal pelvic ultrasound   Electronically Signed   By: Esperanza Heir M.D.   On: 08/10/2013 20:12     2020:  Pelvic exam performed with permission of pt and female ED RN assist during exam.  External genitalia w/o lesions. Vaginal vault without discharge, +dark blood in vault.  Cervix w/o lesions, not friable, GC/chlam and wet prep obtained and sent to lab.  Bimanual exam w/o CMT, uterine or adnexal tenderness.  Pt states she would like to get dressed now and call for her ride. Climbs on and off stretcher easily by herself. Gait steady.  Will await wet prep results and then  d/c.   2045:  Workup reassuring. Will tx for BV. Dx and testing d/w pt.  Questions answered.  Verb understanding, agreeable to d/c home with outpt f/u.      Laray Anger, DO 08/12/13 2029

## 2013-08-10 NOTE — ED Notes (Signed)
MD at bedside. 

## 2013-08-10 NOTE — Discharge Instructions (Signed)
°Emergency Department Resource Guide °1) Find a Doctor and Pay Out of Pocket °Although you won't have to find out who is covered by your insurance plan, it is a good idea to ask around and get recommendations. You will then need to call the office and see if the doctor you have chosen will accept you as a new patient and what types of options they offer for patients who are self-pay. Some doctors offer discounts or will set up payment plans for their patients who do not have insurance, but you will need to ask so you aren't surprised when you get to your appointment. ° °2) Contact Your Local Health Department °Not all health departments have doctors that can see patients for sick visits, but many do, so it is worth a call to see if yours does. If you don't know where your local health department is, you can check in your phone book. The CDC also has a tool to help you locate your state's health department, and many state websites also have listings of all of their local health departments. ° °3) Find a Walk-in Clinic °If your illness is not likely to be very severe or complicated, you may want to try a walk in clinic. These are popping up all over the country in pharmacies, drugstores, and shopping centers. They're usually staffed by nurse practitioners or physician assistants that have been trained to treat common illnesses and complaints. They're usually fairly quick and inexpensive. However, if you have serious medical issues or chronic medical problems, these are probably not your best option. ° °No Primary Care Doctor: °- Call [facility] at  832-8000 - they can help you locate a primary care doctor that  accepts your insurance, provides certain services, etc. °- Physician Referral Service- 1-800-533-3463 ° °Chronic Pain Problems: °Organization         Address  Phone   Notes  °[facility]  ([phone]) [phone] Patients need to be referred by their primary care doctor.  ° °Medication  Assistance: °Organization         Address  Phone   Notes  °[place] [facility] [address] °[place], [place] [address] (336) 641-8030 --Must be a resident of [place] °-- Must have NO insurance coverage whatsoever (no Medicaid/ Medicare, etc.) °-- The pt. MUST have a primary care doctor that directs their care regularly and follows them in the community °  °[facility]  (866) 331-1348   °[employer]  (888) 892-1162   ° °Agencies that provide inexpensive medical care: °Organization         Address  Phone   Notes  °[facility]  (336) 832-8035   °[facility]    (336) 832-7272   °[facility] [address] °[place], [place] [address] (336) 832-4777   °[facility] [place] [facility] [address], °[place] (336) 271-4999   °[facility]    (336) 373-0678   °[facility]    (336) 272-1050   °[facility] ° [address], [place] Phone:  (336) 832-4444, Fax:  (336) 832-4440 Hours of Operation:  9 am - 6 pm, M-F.  Also accepts Medicaid/Medicare and self-pay.  °[facility] ° [address], [place] Phone: (336) [phone], Fax: (336) [phone]. Hours of Operation:  8:30 am - 5:30 pm, M-F.  Also accepts Medicaid and self-pay.  °[facility] [place] [address], [place] Phone: (336) 878-6027   °[facility] [address], [place], [place] (336)723-1848, Ext. 123 Mondays & Thursdays: 7-9 AM.  First 15 patients are seen on a first come, first serve basis. °  ° °Medicaid-accepting [place] Providers: ° °Organization         Address  Phone   Notes  °[facility] [address], [place] (336) [phone] Also accepts self-pay patients.  °[facility] [address], [place] ° (336) 856-9996   °[facility] [address], [place]  (336) 288-8857   °[facility] [address] [place] [address], [place] (336) 299-7000   °Veita Bland [address], [place]  ° (336) 373-1557 Only accepts [place] Access Medicaid patients after they have their name applied to their card.  ° °Self-Pay (no insurance) in [place]: ° °Organization         Address  Phone   Notes  °Sickle Cell Patients, [facility] [address], [place] (336) 832-1970   °[facility] [address], [place] (336) 832-4400   °[facility] ° [address], [place] (336) 992-4800   °[facility]/Dr. Osei-Bonsu ° [address] [place] [address], [place] or [address], [place] (336) [phone] Phone number for both [place] and [place] locations is the same.  °[facility] [address], [place] (336) 299-0000   °[facility] [address] [place] [address], [place] or [address] (336) 852-7530 °(336) 878-2260   °[facility] [address], [place] (336) 350-1642, phone; (336) 294-5005, fax Sees patients 1st and 3rd Saturday of every month.  Must not qualify for public or private insurance (i.e. Medicaid, Medicare, [place] Health Choice, Veterans' Benefits) • Household income should be no more than 200% of the poverty level •The clinic cannot treat you if you are pregnant or think you are pregnant • Sexually transmitted diseases are not treated at the clinic.  ° ° °Dental Care: °Organization         Address  Phone  Notes  °[place] [facility] [address], [place] (336) 641-6152 Accepts children up to age 21 who are enrolled in Medicaid or [place] Health Choice; pregnant women with a Medicaid card; and children who have applied for Medicaid or [place] Health Choice, but were declined, whose parents can pay a reduced fee at time of service.  °[place]  [facility] [place]  [address], [place] (336) 641-7733 Accepts children up to age 21 who are enrolled in Medicaid or [place] Health Choice; pregnant women with a Medicaid card; and children who have applied for Medicaid or [place] Health Choice, but were declined, whose parents can pay a reduced fee at time of service.  °[facility] ° [address], [place] (336) 641-4533 Patients are seen by appointment only. Walk-ins are not accepted. [facility] will see patients 18 years of age and older. °Monday - Tuesday (8am-5pm) °Most Wednesdays (8:30-5pm) °$30 per visit, cash only  °[facility] ° [address], [place] (336) 641-4533 Patients are seen by appointment only. Walk-ins are not accepted. [facility] will see patients 18 years of age and older. °One   Wednesday Evening (Monthly: Volunteer Based).  $30 per visit, cash only  °[facility]  (919) [phone] for adults; Children under age 4, call Graduate Pediatric Dentistry at ([phone]) [phone]. Children aged 4-14, please call ([phone]) [phone] to request a pediatric application. ° Dental services are provided in all areas of dental care including fillings, crowns and bridges, complete and partial dentures, implants, gum treatment, root canals, and extractions. Preventive care is also provided. Treatment is provided to both adults and children. °Patients are selected via a lottery and there is often a waiting list. °  °[facility] [address], °[place] ° (336) 763-8833 [email or web address] °  °[facility] [address], [place], [place] (336)723-1848, Ext. 123 Second and Fourth Thursday of each month, opens at 6:30 AM; Clinic ends at 9 AM.  Patients are seen on a first-come first-served basis, and a limited number are seen during each clinic.  ° °[facility] ° [address], [place], [place] (336) 723-7904    Eligibility Requirements °You must have lived in [place], [place], or [place] counties for at least the last three months. °  You cannot be eligible for state or federal sponsored healthcare insurance, including Veterans Administration, Medicaid, or Medicare. °  You generally cannot be eligible for healthcare insurance through your employer.  °  How to apply: °Eligibility screenings are held every Tuesday and Wednesday afternoon from 1:00 pm until 4:00 pm. You do not need an appointment for the interview!  °[facility] [address], [place], [place] 336-631-2330   °[facility]  336-342-8273   °[facility]  336-703-3100   °[facility]  336-570-6415   ° °Behavioral Health Resources in the Community: °Intensive Outpatient Programs °Organization         Address  Phone  Notes  °[facility] [address], [place], [place] 336-878-6098   °[facility] [address], [place], [place] 336-832-9800   °[facility] [address], [place], [place] ° 336-882-2125   °[facility] [address],  °[place], [place] 1-800-853-5163 or 336-641-4981   °Substance Abuse Resources °Organization         Address  Phone  Notes  °[facility]  336-882-2125   °[facility]  336-784-9470   °[facility]  336-285-9073   °[facility]  336-845-3988   °Residential & Outpatient Substance Abuse Program  1-800-659-3381   °Psychological Services °Organization         Address  Phone  Notes  °[facility]  336- 832-9600   °[facility]  336- 378-7881   °[facility] [address], [place] 1-800-853-5163 or 336-641-4981   ° °Mobile Crisis Teams °Organization         Address  Phone  Notes  °[facility], Mobile Crisis Care Unit  1-877-626-1772   °Assertive °[facility] ° [address]  [place], [place] 336-834-9664   °Sharon DeEsch [address] °[place] 336-554-5454   ° °Self-Help/Support Groups °Organization         Address  Phone             Notes  °[facility]  - variety of support groups  336- 373-1402 Call for more information  °Narcotics Anonymous (NA), [facility] [address], °[place]  2 meetings at this location  ° °  Residential Treatment Programs Organization         Address  Phone  Notes  [facility] [address],    [place]  [phone]) [phone]   [facility]  [address], [place] [phone], [place], [place] [phone]   [facility] [address], [place] [phone] Admissions: 8am-3pm M-F  [facility] [address]    [place], [place] [phone]   [facility] [address], [place], [place] [phone]   [facility] [address]  [place], [place] [phone]   [facility] Intensive Outpatient [address], [place], [place] [phone]   [facility] [address]  [place], [place] [phone] or [phone]   [facility] ([facility]) [address] [place], [place] [phone] Accepts Medicaid  [facility] [address]  [place] [phone] Substance Abuse/Addiction Treatment   [facility]         Address  Phone  Notes  [facility]  (217) 119-2009([phone]) [phone]   Angie FavaJulie Brannon, PhD [address], [address] [place], [place]   ([phone](336) [phone] or [phone](336) [phone]   [facility]   [address] [place], [place] [phone]) [phone]   [facility] [address], [place], [place] [phone](336) [phone] Insurance/Medicaid/sponsorship through [facility] [address]                                    [place], [place] [phone]) [phone] Therapy/tele-psych/case    [facility] [address]   [place], [place] [phone]) [phone]    Dr. Lolly MustacheArfeen  [phone]   [facility]  [employer] [facility] 1) [address], [place] 2) [address], [place] 3)  [address], [place] [phone](336) [phone]  [phone]) [phone]   [facility] Child Abuse Hotline [phone](336) [phone] or [phone](336) ([phone] (After Hours)       Take the prescriptions as directed.  Increase your fluid intake (ie:  Gatoraide) for the next few days.  Eat a bland diet and advance to your regular diet slowly as you can tolerate it. Call your regular medical doctor Monday to schedule a follow up appointment in the next 2 days.  Return to the Emergency Department immediately if not improving (or even worsening) despite taking the medicines as prescribed, any black or bloody stool or vomit, if you develop a fever over "101," or for any other concerns.

## 2013-08-10 NOTE — ED Notes (Signed)
PT tolerated 120 cc of po fluid.

## 2013-08-10 NOTE — ED Notes (Signed)
N/V/D started this am.  On menstrual cycle (started this am).

## 2013-08-12 LAB — GC/CHLAMYDIA PROBE AMP
CT PROBE, AMP APTIMA: NEGATIVE
GC Probe RNA: NEGATIVE

## 2016-07-05 ENCOUNTER — Encounter: Payer: Self-pay | Admitting: *Deleted

## 2016-12-17 ENCOUNTER — Emergency Department
Admission: EM | Admit: 2016-12-17 | Discharge: 2016-12-17 | Disposition: A | Discharge status: Home / Self Care | Payer: Self-pay | Attending: Emergency Medicine | Admitting: Emergency Medicine

## 2016-12-17 ENCOUNTER — Other Ambulatory Visit: Payer: Self-pay

## 2016-12-17 ENCOUNTER — Encounter: Payer: Self-pay | Admitting: Emergency Medicine

## 2016-12-17 DIAGNOSIS — F10120 Alcohol abuse with intoxication, uncomplicated: Secondary | ICD-10-CM | POA: Insufficient documentation

## 2016-12-17 DIAGNOSIS — Z87891 Personal history of nicotine dependence: Secondary | ICD-10-CM | POA: Insufficient documentation

## 2016-12-17 DIAGNOSIS — R1013 Epigastric pain: Secondary | ICD-10-CM | POA: Insufficient documentation

## 2016-12-17 DIAGNOSIS — R51 Headache: Secondary | ICD-10-CM | POA: Insufficient documentation

## 2016-12-17 DIAGNOSIS — Z79899 Other long term (current) drug therapy: Secondary | ICD-10-CM | POA: Insufficient documentation

## 2016-12-17 LAB — CBC
HCT: 41.3 % (ref 35.0–47.0)
Hemoglobin: 13.8 g/dL (ref 12.0–16.0)
MCH: 30.7 pg (ref 26.0–34.0)
MCHC: 33.4 g/dL (ref 32.0–36.0)
MCV: 91.9 fL (ref 80.0–100.0)
PLATELETS: 335 10*3/uL (ref 150–440)
RBC: 4.49 MIL/uL (ref 3.80–5.20)
RDW: 13 % (ref 11.5–14.5)
WBC: 17.7 10*3/uL — AB (ref 3.6–11.0)

## 2016-12-17 LAB — COMPREHENSIVE METABOLIC PANEL
ALK PHOS: 52 U/L (ref 38–126)
ALT: 19 U/L (ref 14–54)
AST: 22 U/L (ref 15–41)
Albumin: 4.5 g/dL (ref 3.5–5.0)
Anion gap: 9 (ref 5–15)
BUN: 13 mg/dL (ref 6–20)
CALCIUM: 9.4 mg/dL (ref 8.9–10.3)
CHLORIDE: 102 mmol/L (ref 101–111)
CO2: 24 mmol/L (ref 22–32)
CREATININE: 0.64 mg/dL (ref 0.44–1.00)
GFR calc Af Amer: >60 mL/min (ref >60)
Glucose, Bld: 91 mg/dL (ref 65–99)
Potassium: 4.2 mmol/L (ref 3.5–5.1)
Sodium: 135 mmol/L (ref 135–145)
Total Bilirubin: 1 mg/dL (ref 0.3–1.2)
Total Protein: 8.1 g/dL (ref 6.5–8.1)

## 2016-12-17 LAB — URINALYSIS, COMPLETE (UACMP) WITH MICROSCOPIC
Bacteria, UA: NONE SEEN
Bilirubin Urine: NEGATIVE
GLUCOSE, UA: NEGATIVE mg/dL
Hgb urine dipstick: NEGATIVE
Ketones, ur: NEGATIVE mg/dL
Leukocytes, UA: NEGATIVE
Nitrite: NEGATIVE
PH: 8 (ref 5.0–8.0)
Protein, ur: NEGATIVE mg/dL
Specific Gravity, Urine: 1.017 (ref 1.005–1.030)
WBC, UA: NONE SEEN WBC/hpf (ref 0–5)

## 2016-12-17 LAB — POCT PREGNANCY, URINE: Preg Test, Ur: NEGATIVE

## 2016-12-17 LAB — LIPASE, BLOOD: LIPASE: 28 U/L (ref 11–51)

## 2016-12-17 MED ORDER — ONDANSETRON HCL 4 MG PO TABS: 4.0000 mg | ORAL_TABLET | Freq: Every day | ORAL | 0 refills | Status: AC | PRN

## 2016-12-17 NOTE — Discharge Instructions (Signed)
When you drink please make sure you remain well-hydrated.  Follow-up with your primary care physician as needed and return to the emergency department for any concerns.  It was a pleasure to take care of you today, and thank you for coming to our emergency department.  If you have any questions or concerns before leaving please ask the nurse to grab me and I'm more than happy to go through your aftercare instructions again.  If you were prescribed any opioid pain medication today such as Norco, Vicodin, Percocet, morphine, hydrocodone, or oxycodone please make sure you do not drive when you are taking this medication as it can alter your ability to drive safely.  If you have any concerns once you are home that you are not improving or are in fact getting worse before you can make it to your follow-up appointment, please do not hesitate to call 911 and come back for further evaluation.  Merrily BrittleNeil Makynzie Dobesh, MD  Results for orders placed or performed during the hospital encounter of 12/17/16  Lipase, blood  Result Value Ref Range   Lipase 28 11 - 51 U/L  Comprehensive metabolic panel  Result Value Ref Range   Sodium 135 135 - 145 mmol/L   Potassium 4.2 3.5 - 5.1 mmol/L   Chloride 102 101 - 111 mmol/L   CO2 24 22 - 32 mmol/L   Glucose, Bld 91 65 - 99 mg/dL   BUN 13 6 - 20 mg/dL   Creatinine, Ser 1.610.64 0.44 - 1.00 mg/dL   Calcium 9.4 8.9 - 09.610.3 mg/dL   Total Protein 8.1 6.5 - 8.1 g/dL   Albumin 4.5 3.5 - 5.0 g/dL   AST 22 15 - 41 U/L   ALT 19 14 - 54 U/L   Alkaline Phosphatase 52 38 - 126 U/L   Total Bilirubin 1.0 0.3 - 1.2 mg/dL   GFR calc non Af Amer >60 >60 mL/min   GFR calc Af Amer >60 >60 mL/min   Anion gap 9 5 - 15  CBC  Result Value Ref Range   WBC 17.7 (H) 3.6 - 11.0 K/uL   RBC 4.49 3.80 - 5.20 MIL/uL   Hemoglobin 13.8 12.0 - 16.0 g/dL   HCT 04.541.3 40.935.0 - 81.147.0 %   MCV 91.9 80.0 - 100.0 fL   MCH 30.7 26.0 - 34.0 pg   MCHC 33.4 32.0 - 36.0 g/dL   RDW 91.413.0 78.211.5 - 95.614.5 %   Platelets 335 150 - 440 K/uL  Urinalysis, Complete w Microscopic  Result Value Ref Range   Color, Urine YELLOW (A) YELLOW   APPearance CLEAR (A) CLEAR   Specific Gravity, Urine 1.017 1.005 - 1.030   pH 8.0 5.0 - 8.0   Glucose, UA NEGATIVE NEGATIVE mg/dL   Hgb urine dipstick NEGATIVE NEGATIVE   Bilirubin Urine NEGATIVE NEGATIVE   Ketones, ur NEGATIVE NEGATIVE mg/dL   Protein, ur NEGATIVE NEGATIVE mg/dL   Nitrite NEGATIVE NEGATIVE   Leukocytes, UA NEGATIVE NEGATIVE   RBC / HPF 0-5 0 - 5 RBC/hpf   WBC, UA NONE SEEN 0 - 5 WBC/hpf   Bacteria, UA NONE SEEN NONE SEEN   Squamous Epithelial / LPF 0-5 (A) NONE SEEN  Pregnancy, urine POC  Result Value Ref Range   Preg Test, Ur NEGATIVE NEGATIVE

## 2016-12-17 NOTE — ED Provider Notes (Signed)
[facility] Emergency Department Provider Note  ____________________________________________   First MD Initiated Contact with Patient 12/17/16 1349     (approximate)  I have reviewed the triage vital signs and the nursing notes.   HISTORY  Chief Complaint Emesis and Nasal Congestion    HPI Mercedes Page is a 26 y.o. female who self presents to the emergency department with epigastric burning sensation nausea vomiting that began when she awoke this morning.  She felt in her usual state of health yesterday and went out for her sister's 22st birthday where she drank alcohol heavily.  She woke this morning feeling nauseated, vomited bile, weak, with a mild severity headache.  She has had similar symptoms before which have been related to alcohol ingestion however she wanted to make sure that everything was okay.  Her symptoms began when she awoke and has been gradually improving.  She is mostly asymptomatic now.  They seem to be worse with alcohol improved when not drinking.  History reviewed. No pertinent past medical history.  There are no active problems to display for this patient.   Past Surgical History:  Procedure Laterality Date  . addenoidectomy    . TONSILLECTOMY      Prior to Admission medications   Medication Sig Start Date End Date Taking? Authorizing Provider  acetaminophen (TYLENOL) 500 MG tablet Take 500-1,000 mg by mouth every 6 (six) hours as needed for mild pain or moderate pain.    [provider]  metroNIDAZOLE (FLAGYL) 500 MG tablet Take 1 tablet (500 mg total) by mouth 2 (two) times daily. 08/10/13   Samuel JesterMcManus, Kathleen, DO  naproxen (NAPROSYN) 250 MG tablet Take 1 tablet (250 mg total) by mouth 2 (two) times daily with a meal. 08/10/13   Samuel JesterMcManus, Kathleen, DO  ondansetron (ZOFRAN) 4 MG tablet Take 1 tablet (4 mg total) by mouth every 8 (eight) hours as needed for nausea or vomiting. 08/10/13   Samuel JesterMcManus, Kathleen, DO  ondansetron  (ZOFRAN) 4 MG tablet Take 1 tablet (4 mg total) by mouth daily as needed for nausea or vomiting. 12/17/16 12/17/17  Merrily Brittleifenbark, Kailah Pennel, MD    Allergies Patient has no known allergies.  No family history on file.  Social History Social History   Tobacco Use  . Smoking status: Former Smoker  Substance Use Topics  . Alcohol use: No  . Drug use: No    Review of Systems Constitutional: No fever/chills Eyes: No visual changes. ENT: No sore throat. Cardiovascular: Denies chest pain. Respiratory: Denies shortness of breath. Gastrointestinal: Positive for abdominal pain.  Positive for nausea, positive for vomiting.  No diarrhea.  No constipation. Neurological: Positive for headache   ____________________________________________   PHYSICAL EXAM:  VITAL SIGNS: ED Triage Vitals  Enc Vitals Group     BP 12/17/16 1231 129/84     Pulse Rate 12/17/16 1231 68     Resp 12/17/16 1231 20     Temp 12/17/16 1231 98.6 F (37 C)     Temp Source 12/17/16 1231 Oral     SpO2 12/17/16 1231 98 %     Weight 12/17/16 1232 190 lb (86.2 kg)     Height 12/17/16 1232 5\' 6"  (1.676 m)     Head Circumference --      Peak Flow --      Pain Score --      Pain Loc --      Pain Edu? --      Excl. in GC? --  Constitutional: Alert and oriented x4 well-appearing nontoxic no diaphoresis speaks full clear sentences Head: Atraumatic. Nose: No congestion/rhinnorhea. Mouth/Throat: No trismus Neck: No stridor.   Cardiovascular: Normal rate, regular rhythm.  Respiratory: Normal respiratory effort.  No retractions.  Gastrointestinal: Soft nontender Musculoskeletal: No lower extremity edema   Neurologic:  Normal speech and language. No gross focal neurologic deficits are appreciated. Skin:  Skin is warm, dry and intact. No rash noted. Psychiatric: Mood and affect are normal. Speech and behavior are normal.    ____________________________________________   DIFFERENTIAL includes but not limited  to  Dehydration, alcohol intoxication, alcohol poisoning ____________________________________________   LABS (all labs ordered are listed, but only abnormal results are displayed)  Labs Reviewed  CBC - Abnormal; Notable for the following components:      Result Value   WBC 17.7 (*)    All other components within normal limits  URINALYSIS, COMPLETE (UACMP) WITH MICROSCOPIC - Abnormal; Notable for the following components:   Color, Urine YELLOW (*)    APPearance CLEAR (*)    Squamous Epithelial / LPF 0-5 (*)    All other components within normal limits  LIPASE, BLOOD  COMPREHENSIVE METABOLIC PANEL  POCT PREGNANCY, URINE  POC URINE PREG, ED    Blood work reviewed by me shows elevated white count which is nonspecific __________________________________________  EKG   ____________________________________________  RADIOLOGY   ____________________________________________   PROCEDURES  Procedure(s) performed: no  Procedures  Critical Care performed: no  Observation: no ____________________________________________   INITIAL IMPRESSION / ASSESSMENT AND PLAN / ED COURSE  Pertinent labs & imaging results that were available during my care of the patient were reviewed by me and considered in my medical decision making (see chart for details).  The patient arrives with nausea vomiting and headache as well as some abdominal discomfort the night after drinking heavy alcohol.  Her symptoms have been slowly improving.  She does have a long-standing history of gastric reflux.  I have encouraged the patient to remain well-hydrated and to decrease her alcohol consumption.  We will give her Zofran for outpatient treatment.  She is able to eat and drink without difficulty and feels improved.  She is medically stable for outpatient management verbalizes understanding and agreement with the plan.      ____________________________________________   FINAL CLINICAL IMPRESSION(S) /  ED DIAGNOSES  Final diagnoses:  Hangover without complication (HCC)      NEW MEDICATIONS STARTED DURING THIS VISIT:  This SmartLink is deprecated. Use AVSMEDLIST instead to display the medication list for a patient.   Note:  This document was prepared using Dragon voice recognition software and may include unintentional dictation errors.     Merrily Brittleifenbark, Jaila Schellhorn, MD 12/17/16 1506

## 2016-12-17 NOTE — ED Triage Notes (Signed)
States nasal congestion since this am. Also has nausea and vomiting x 10 today but states possibly related to ETOH intake last night. States drank at least 5 shots of tequila. Denies abd pain but states has nausea.

## 2017-08-02 ENCOUNTER — Encounter: Payer: Self-pay | Admitting: Emergency Medicine

## 2017-08-02 ENCOUNTER — Emergency Department
Admission: EM | Admit: 2017-08-02 | Discharge: 2017-08-02 | Disposition: A | Discharge status: Home / Self Care | Payer: Self-pay | Attending: Emergency Medicine | Admitting: Emergency Medicine

## 2017-08-02 DIAGNOSIS — Z87891 Personal history of nicotine dependence: Secondary | ICD-10-CM | POA: Insufficient documentation

## 2017-08-02 DIAGNOSIS — Z79899 Other long term (current) drug therapy: Secondary | ICD-10-CM | POA: Insufficient documentation

## 2017-08-02 DIAGNOSIS — M2141 Flat foot [pes planus] (acquired), right foot: Secondary | ICD-10-CM | POA: Insufficient documentation

## 2017-08-02 DIAGNOSIS — M7661 Achilles tendinitis, right leg: Secondary | ICD-10-CM | POA: Insufficient documentation

## 2017-08-02 DIAGNOSIS — M2142 Flat foot [pes planus] (acquired), left foot: Secondary | ICD-10-CM | POA: Insufficient documentation

## 2017-08-02 MED ORDER — NAPROXEN 500 MG PO TABS: 500.0000 mg | ORAL_TABLET | Freq: Two times a day (BID) | ORAL | Status: DC

## 2017-08-02 NOTE — Discharge Instructions (Addendum)
Follow discharge care instructions and take medication directed.  Advised to follow-up with podiatry for definitive evaluation and treatment.

## 2017-08-02 NOTE — ED Triage Notes (Signed)
Pt reports since yesterday she has had pain to her right knee and ankle. 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 No obvious swelling noted. Pt denies obvious injuries.

## 2017-08-02 NOTE — ED Provider Notes (Signed)
[facility] Emergency Department Provider Note   ____________________________________________   First MD Initiated Contact with Patient 08/02/17 1130     (approximate)  I have reviewed the triage vital signs and the nursing notes.   HISTORY  Chief Complaint Ankle Pain and Knee Pain    HPI Mercedes Page is a 27 y.o. female patient complain of right calf and posterior ankle pain for 2 days.  Patient states her job requires 12 hours standing.  Patient states she is having trouble weightbearing secondary to the pain.  Patient did pain increased with dorsal or flexion of the foot.  Patient denies any other provocative incident for complaint.  Patient rates pain as a 9/10.  Patient described the pain is "aching".  No palliative measures for complaint.  History reviewed. No pertinent past medical history.  There are no active problems to display for this patient.   Past Surgical History:  Procedure Laterality Date  . addenoidectomy    . TONSILLECTOMY      Prior to Admission medications   Medication Sig Start Date End Date Taking? Authorizing Provider  acetaminophen (TYLENOL) 500 MG tablet Take 500-1,000 mg by mouth every 6 (six) hours as needed for mild pain or moderate pain.    [provider]  metroNIDAZOLE (FLAGYL) 500 MG tablet Take 1 tablet (500 mg total) by mouth 2 (two) times daily. 08/10/13   Samuel Jester, DO  naproxen (NAPROSYN) 250 MG tablet Take 1 tablet (250 mg total) by mouth 2 (two) times daily with a meal. 08/10/13   Samuel Jester, DO  naproxen (NAPROSYN) 500 MG tablet Take 1 tablet (500 mg total) by mouth 2 (two) times daily with a meal. 08/02/17   Joni Reining, PA-C  ondansetron (ZOFRAN) 4 MG tablet Take 1 tablet (4 mg total) by mouth every 8 (eight) hours as needed for nausea or vomiting. 08/10/13   Samuel Jester, DO  ondansetron (ZOFRAN) 4 MG tablet Take 1 tablet (4 mg total) by mouth daily as needed for nausea or  vomiting. 12/17/16 12/17/17  Merrily Brittle, MD    Allergies Patient has no known allergies.  No family history on file.  Social History Social History   Tobacco Use  . Smoking status: Former Smoker  Substance Use Topics  . Alcohol use: No  . Drug use: No    Review of Systems Constitutional: No fever/chills Eyes: No visual changes. ENT: No sore throat. Cardiovascular: Denies chest pain. Respiratory: Denies shortness of breath. Gastrointestinal: No abdominal pain.  No nausea, no vomiting.  No diarrhea.  No constipation. Genitourinary: Negative for dysuria. Musculoskeletal: Right calf and posterior ankle pain. Skin: Negative for rash. Neurological: Negative for headaches, focal weakness or numbness.   ____________________________________________   PHYSICAL EXAM:  VITAL SIGNS: ED Triage Vitals [08/02/17 1119]  Enc Vitals Group     BP      Pulse      Resp      Temp      Temp src      SpO2      Weight 184 lb (83.5 kg)     Height 5\' 6"  (1.676 m)     Head Circumference      Peak Flow      Pain Score 9     Pain Loc      Pain Edu?      Excl. in GC?    Constitutional: Alert and oriented. Well appearing and in no acute distress. Cardiovascular: Normal rate, regular rhythm.  Grossly normal heart sounds.  Good peripheral circulation. Respiratory: Normal respiratory effort.  No retractions. Lungs CTAB. Musculoskeletal: Bilateral pes planus.  Moderate guarding palpation at the insertion point of the right Achilles tendon.  No obvious edema or ecchymosis.  Patient has full equal range of motion of the right foot.  Incidental findings of bunion to the lateral aspect of the right foot. Neurologic:  Normal speech and language. No gross focal neurologic deficits are appreciated. No gait instability. Skin:  Skin is warm, dry and intact. No rash noted. Psychiatric: Mood and affect are normal. Speech and behavior are normal.  ____________________________________________    LABS (all labs ordered are listed, but only abnormal results are displayed)  Labs Reviewed - No data to display ____________________________________________  EKG   ____________________________________________  RADIOLOGY  ED MD interpretation:    Official radiology report(s): No results found.  ____________________________________________   PROCEDURES  Procedure(s) performed:   Procedures  Critical Care performed: No  ____________________________________________   INITIAL IMPRESSION / ASSESSMENT AND PLAN / ED COURSE  As part of my medical decision making, I reviewed the following data within the electronic MEDICAL RECORD NUMBER    Right calf and ankle pain secondary to Achilles tendinitis.  Patient also has pes planus.  Patient given discharge care instruction.  Patient advised follow-up for podiatry for definitive evaluation and treatment.  Patient given a prescription for naproxen.      ____________________________________________   FINAL CLINICAL IMPRESSION(S) / ED DIAGNOSES  Final diagnoses:  Achilles tendinitis, right leg  Pes planus of both feet     ED Discharge Orders        Ordered    naproxen (NAPROSYN) 500 MG tablet  2 times daily with meals     08/02/17 1143       Note:  This document was prepared using Dragon voice recognition software and may include unintentional dictation errors.    Joni ReiningSmith, Victora Irby K, PA-C 08/02/17 1148    Sharyn CreamerQuale, Mark, MD 08/02/17 [phone]

## 2017-08-21 ENCOUNTER — Emergency Department
Admission: EM | Admit: 2017-08-21 | Discharge: 2017-08-21 | Disposition: A | Discharge status: Home / Self Care | Payer: Self-pay | Attending: Student in an Organized Health Care Education/Training Program | Admitting: Student in an Organized Health Care Education/Training Program

## 2017-08-21 ENCOUNTER — Encounter: Payer: Self-pay | Admitting: Intensive Care

## 2017-08-21 ENCOUNTER — Other Ambulatory Visit: Payer: Self-pay

## 2017-08-21 DIAGNOSIS — R21 Rash and other nonspecific skin eruption: Secondary | ICD-10-CM | POA: Insufficient documentation

## 2017-08-21 DIAGNOSIS — Z87891 Personal history of nicotine dependence: Secondary | ICD-10-CM | POA: Insufficient documentation

## 2017-08-21 DIAGNOSIS — Z79899 Other long term (current) drug therapy: Secondary | ICD-10-CM | POA: Insufficient documentation

## 2017-08-21 DIAGNOSIS — L03114 Cellulitis of left upper limb: Secondary | ICD-10-CM | POA: Insufficient documentation

## 2017-08-21 MED ORDER — DIPHENHYDRAMINE HCL 25 MG PO CAPS: 25.0000 mg | ORAL_CAPSULE | ORAL | 0 refills | Status: DC | PRN

## 2017-08-21 MED ORDER — CLINDAMYCIN HCL 300 MG PO CAPS: 300.0000 mg | ORAL_CAPSULE | Freq: Four times a day (QID) | ORAL | 0 refills | Status: DC

## 2017-08-21 MED ORDER — METHYLPREDNISOLONE SODIUM SUCC 125 MG IJ SOLR
125.0000 mg | Freq: Once | INTRAMUSCULAR | Status: AC
  Administered 2017-08-21: 125 mg via INTRAMUSCULAR
  Filled 2017-08-21: qty 2

## 2017-08-21 MED ORDER — CLINDAMYCIN HCL 150 MG PO CAPS
300.0000 mg | ORAL_CAPSULE | Freq: Once | ORAL | Status: AC
  Administered 2017-08-21: 300 mg via ORAL
  Filled 2017-08-21: qty 2

## 2017-08-21 MED ORDER — CLINDAMYCIN PHOSPHATE 600 MG/4ML IJ SOLN
600.0000 mg | Freq: Once | INTRAMUSCULAR | Status: AC
  Administered 2017-08-21: 600 mg via INTRAMUSCULAR
  Filled 2017-08-21: qty 4

## 2017-08-21 MED ORDER — CLINDAMYCIN HCL 300 MG PO CAPS: 300.0000 mg | ORAL_CAPSULE | Freq: Four times a day (QID) | ORAL | 0 refills | Status: AC

## 2017-08-21 MED ORDER — CLINDAMYCIN PHOSPHATE 300 MG/2ML IJ SOLN
INTRAMUSCULAR | Status: AC
  Filled 2017-08-21: qty 4

## 2017-08-21 NOTE — ED Triage Notes (Signed)
Patient reports she was at a cookout yesterday and started experiencing L hand swelling with pain and today is experiencing rash spreading to R hand and feet. Small raised red bumps present on hands and feet

## 2017-08-21 NOTE — ED Provider Notes (Signed)
[facility] Emergency Department Provider Note  ____________________________________________  Time seen: Approximately 5:09 PM  I have reviewed the triage vital signs and the nursing notes.   HISTORY  Chief Complaint Hand Pain (L hand swelling)    HPI Mercedes Page is a 27 y.o. female that presents to the emergency department for evaluation of rash for 1 day.  Patient states that she noticed some small red bumps to the back of her hands and sides of her feet yesterday when she was at a cookout. She thought they were mosquito bites. She was scratching the bump on the back of her left hand last night.  This morning she woke up and area was red and swollen.  Rash itched yesterday but does not itch today.  She does not take any daily medications.  No known bedbugs.  No fleas.  She does not recall being bitten by anything. No trauma. No fever, chills, nausea, vomiting.  History reviewed. No pertinent past medical history.  There are no active problems to display for this patient.   Past Surgical History:  Procedure Laterality Date  . addenoidectomy    . TONSILLECTOMY      Prior to Admission medications   Medication Sig Start Date End Date Taking? Authorizing Provider  acetaminophen (TYLENOL) 500 MG tablet Take 500-1,000 mg by mouth every 6 (six) hours as needed for mild pain or moderate pain.    [provider]  clindamycin (CLEOCIN) 300 MG capsule Take 1 capsule (300 mg total) by mouth 4 (four) times daily for 10 days. 08/21/17 08/31/17  Enid Derry, PA-C  diphenhydrAMINE (BENADRYL) 25 mg capsule Take 1 capsule (25 mg total) by mouth every 4 (four) hours as needed. 08/21/17 08/21/18  Enid Derry, PA-C  metroNIDAZOLE (FLAGYL) 500 MG tablet Take 1 tablet (500 mg total) by mouth 2 (two) times daily. 08/10/13   Samuel Jester, DO  naproxen (NAPROSYN) 250 MG tablet Take 1 tablet (250 mg total) by mouth 2 (two) times daily with a meal. 08/10/13   Samuel Jester, DO  naproxen (NAPROSYN) 500 MG tablet Take 1 tablet (500 mg total) by mouth 2 (two) times daily with a meal. 08/02/17   Joni Reining, PA-C  ondansetron (ZOFRAN) 4 MG tablet Take 1 tablet (4 mg total) by mouth every 8 (eight) hours as needed for nausea or vomiting. 08/10/13   Samuel Jester, DO  ondansetron (ZOFRAN) 4 MG tablet Take 1 tablet (4 mg total) by mouth daily as needed for nausea or vomiting. 12/17/16 12/17/17  Merrily Brittle, MD    Allergies Patient has no known allergies.  History reviewed. No pertinent family history.  Social History Social History   Tobacco Use  . Smoking status: Former Games developer  . Smokeless tobacco: Never Used  Substance Use Topics  . Alcohol use: Yes    Comment: occ  . Drug use: No     Review of Systems  Constitutional: No fever/chills Cardiovascular: No chest pain. Respiratory: No SOB. Gastrointestinal: No abdominal pain.  No nausea, no vomiting.  Musculoskeletal: Negative for musculoskeletal pain. Skin: Negative for abrasions, lacerations, ecchymosis. Positive for rash.    ____________________________________________   PHYSICAL EXAM:  VITAL SIGNS: ED Triage Vitals  Enc Vitals Group     BP 08/21/17 1559 117/84     Pulse Rate 08/21/17 1559 (!) 102     Resp 08/21/17 1559 12     Temp 08/21/17 1559 99 F (37.2 C)     Temp Source 08/21/17 1559 Oral  SpO2 08/21/17 1559 100 %     Weight 08/21/17 1600 184 lb (83.5 kg)     Height 08/21/17 1600 5' 5.98" (1.676 m)     Head Circumference --      Peak Flow --      Pain Score 08/21/17 1600 8     Pain Loc --      Pain Edu? --      Excl. in GC? --      Constitutional: Alert and oriented. Well appearing and in no acute distress. Eyes: Conjunctivae are normal. PERRL. EOMI. Head: Atraumatic. ENT:      Ears:      Nose: No congestion/rhinnorhea.      Mouth/Throat: Mucous membranes are moist.  No rash mucous membranes. Neck: No stridor.  Cardiovascular: Normal rate, regular  rhythm.  Good peripheral circulation. Respiratory: Normal respiratory effort without tachypnea or retractions. Lungs CTAB. Good air entry to the bases with no decreased or absent breath sounds. Musculoskeletal: Full range of motion to all extremities. No gross deformities appreciated. Neurologic:  Normal speech and language. No gross focal neurologic deficits are appreciated.  Skin:  Skin is warm, dry and intact.  1 mm skin defect to dorsal left hand with 1 inch of surrounding erythema.  No fluctuance.  1/4 cm pink raised bump to dorsal right hand, left forearm, lateral right foot.  No volar or plantar rash. No rash or lesions between fingers or toes.  Psychiatric: Mood and affect are normal. Speech and behavior are normal. Patient exhibits appropriate insight and judgement.   ____________________________________________   LABS (all labs ordered are listed, but only abnormal results are displayed)  Labs Reviewed - No data to display ____________________________________________  EKG   ____________________________________________  RADIOLOGY   No results found.  ____________________________________________    PROCEDURES  Procedure(s) performed:    Procedures    Medications  clindamycin (CLEOCIN) injection 600 mg (600 mg Intramuscular Given 08/21/17 1742)  methylPREDNISolone sodium succinate (SOLU-MEDROL) 125 mg/2 mL injection 125 mg (125 mg Intramuscular Given 08/21/17 1742)  clindamycin (CLEOCIN) capsule 300 mg (300 mg Oral Given 08/21/17 1742)     ____________________________________________   INITIAL IMPRESSION / ASSESSMENT AND PLAN / ED COURSE  Pertinent labs & imaging results that were available during my care of the patient were reviewed by me and considered in my medical decision making (see chart for details).  Review of the [place] CSRS was performed in accordance of the [facility] prior to dispensing any controlled drugs.   Patient presented to the emergency department  for evaluation of rash.  Rash appearance is consistent with bug bites. Area to left hand has some surrounding cellulitis. IM clindamycin and prednisone were given.  Patient will be discharged home with prescriptions for Clinamycin. Patient is to follow up with PCP as directed. Patient is given ED precautions to return to the ED for any worsening or new symptoms.     ____________________________________________  FINAL CLINICAL IMPRESSION(S) / ED DIAGNOSES  Final diagnoses:  Rash  Cellulitis of left upper extremity      NEW MEDICATIONS STARTED DURING THIS VISIT:  ED Discharge Orders        Ordered    clindamycin (CLEOCIN) 300 MG capsule  4 times daily,   Status:  Discontinued     08/21/17 1757    clindamycin (CLEOCIN) 300 MG capsule  4 times daily     08/21/17 1759    diphenhydrAMINE (BENADRYL) 25 mg capsule  Every 4 hours PRN  08/21/17 1801          This chart was dictated using voice recognition software/Dragon. Despite best efforts to proofread, errors can occur which can change the meaning. Any change was purely unintentional.    Enid DerryWagner, Giannina Bartolome, PA-C 08/21/17 2327    Willy Eddyobinson, Patrick, MD 08/21/17 2328

## 2017-08-21 NOTE — ED Notes (Signed)
See triage note  Presents with possible insect bites  Red areas noted to left hand ,right finger and right foot

## 2017-08-21 NOTE — Discharge Instructions (Addendum)
Please pick up medication from medication management clinic in the morning. Please return to the emergency department if rash spreads or for worsening pain or erythema to hand.

## 2017-11-25 ENCOUNTER — Emergency Department: Payer: Self-pay

## 2017-11-25 ENCOUNTER — Encounter: Payer: Self-pay | Admitting: Emergency Medicine

## 2017-11-25 ENCOUNTER — Emergency Department
Admission: EM | Admit: 2017-11-25 | Discharge: 2017-11-25 | Disposition: A | Discharge status: Home / Self Care | Payer: Self-pay | Attending: Emergency Medicine | Admitting: Emergency Medicine

## 2017-11-25 ENCOUNTER — Other Ambulatory Visit: Payer: Self-pay

## 2017-11-25 DIAGNOSIS — R197 Diarrhea, unspecified: Secondary | ICD-10-CM

## 2017-11-25 DIAGNOSIS — K529 Noninfective gastroenteritis and colitis, unspecified: Secondary | ICD-10-CM | POA: Insufficient documentation

## 2017-11-25 DIAGNOSIS — R112 Nausea with vomiting, unspecified: Secondary | ICD-10-CM

## 2017-11-25 DIAGNOSIS — Z79899 Other long term (current) drug therapy: Secondary | ICD-10-CM | POA: Insufficient documentation

## 2017-11-25 DIAGNOSIS — Z87891 Personal history of nicotine dependence: Secondary | ICD-10-CM | POA: Insufficient documentation

## 2017-11-25 LAB — COMPREHENSIVE METABOLIC PANEL
ALT: 20 U/L (ref 0–44)
AST: 22 U/L (ref 15–41)
Albumin: 4.5 g/dL (ref 3.5–5.0)
Alkaline Phosphatase: 46 U/L (ref 38–126)
Anion gap: 9 (ref 5–15)
BILIRUBIN TOTAL: 1.3 mg/dL — AB (ref 0.3–1.2)
BUN: 10 mg/dL (ref 6–20)
CHLORIDE: 108 mmol/L (ref 98–111)
CO2: 23 mmol/L (ref 22–32)
CREATININE: 0.59 mg/dL (ref 0.44–1.00)
Calcium: 9.2 mg/dL (ref 8.9–10.3)
Glucose, Bld: 118 mg/dL — ABNORMAL HIGH (ref 70–99)
Potassium: 4 mmol/L (ref 3.5–5.1)
Sodium: 140 mmol/L (ref 135–145)
Total Protein: 8.2 g/dL — ABNORMAL HIGH (ref 6.5–8.1)

## 2017-11-25 LAB — URINALYSIS, COMPLETE (UACMP) WITH MICROSCOPIC
Bilirubin Urine: NEGATIVE
Glucose, UA: NEGATIVE mg/dL
Ketones, ur: NEGATIVE mg/dL
NITRITE: NEGATIVE
PH: 7 (ref 5.0–8.0)
Protein, ur: NEGATIVE mg/dL
SPECIFIC GRAVITY, URINE: 1.021 (ref 1.005–1.030)

## 2017-11-25 LAB — CBC
HEMATOCRIT: 41.8 % (ref 36.0–46.0)
Hemoglobin: 13.8 g/dL (ref 12.0–15.0)
MCH: 30 pg (ref 26.0–34.0)
MCHC: 33 g/dL (ref 30.0–36.0)
MCV: 90.9 fL (ref 80.0–100.0)
NRBC: 0 % (ref 0.0–0.2)
PLATELETS: 400 10*3/uL (ref 150–400)
RBC: 4.6 MIL/uL (ref 3.87–5.11)
RDW: 12.4 % (ref 11.5–15.5)
WBC: 16.8 10*3/uL — AB (ref 4.0–10.5)

## 2017-11-25 LAB — POC URINE PREG, ED: PREG TEST UR: NEGATIVE

## 2017-11-25 LAB — LIPASE, BLOOD: LIPASE: 42 U/L (ref 11–51)

## 2017-11-25 MED ORDER — LOPERAMIDE HCL 2 MG PO CAPS
2.0000 mg | ORAL_CAPSULE | Freq: Once | ORAL | Status: AC
  Administered 2017-11-25: 2 mg via ORAL
  Filled 2017-11-25: qty 1

## 2017-11-25 MED ORDER — PROMETHAZINE HCL 25 MG/ML IJ SOLN
INTRAMUSCULAR | Status: AC
  Filled 2017-11-25: qty 1

## 2017-11-25 MED ORDER — DICYCLOMINE HCL 10 MG/ML IM SOLN
20.0000 mg | Freq: Once | INTRAMUSCULAR | Status: AC
  Administered 2017-11-25: 20 mg via INTRAMUSCULAR
  Filled 2017-11-25: qty 2

## 2017-11-25 MED ORDER — ONDANSETRON 4 MG PO TBDP: 4.0000 mg | ORAL_TABLET | Freq: Three times a day (TID) | ORAL | 0 refills | Status: DC | PRN

## 2017-11-25 MED ORDER — PROMETHAZINE HCL 25 MG/ML IJ SOLN
12.5000 mg | Freq: Once | INTRAMUSCULAR | Status: AC
  Administered 2017-11-25: 12.5 mg via INTRAVENOUS

## 2017-11-25 MED ORDER — KETOROLAC TROMETHAMINE 30 MG/ML IJ SOLN
30.0000 mg | Freq: Once | INTRAMUSCULAR | Status: AC
  Administered 2017-11-25: 30 mg via INTRAVENOUS
  Filled 2017-11-25: qty 1

## 2017-11-25 MED ORDER — MORPHINE SULFATE (PF) 4 MG/ML IV SOLN
4.0000 mg | Freq: Once | INTRAVENOUS | Status: AC
  Administered 2017-11-25: 4 mg via INTRAVENOUS
  Filled 2017-11-25: qty 1

## 2017-11-25 MED ORDER — ONDANSETRON HCL 4 MG/2ML IJ SOLN
4.0000 mg | Freq: Once | INTRAMUSCULAR | Status: AC | PRN
  Administered 2017-11-25: 4 mg via INTRAVENOUS
  Filled 2017-11-25: qty 2

## 2017-11-25 MED ORDER — SODIUM CHLORIDE 0.9 % IV BOLUS
1000.0000 mL | Freq: Once | INTRAVENOUS | Status: AC
  Administered 2017-11-25: 1000 mL via INTRAVENOUS

## 2017-11-25 NOTE — ED Notes (Signed)
Pt assisted to bathroom. Pt states she had another episode of diarrhea.

## 2017-11-25 NOTE — ED Notes (Signed)
Pt back and forth to bathroom without assistance. Pt noted to be loudly retching while in the bathroom, this RN to bathroom to check on patient when she began screaming while in the bathroom. Pt c/o continued abdominal pain. This RN discussed pain with MD, MD ordered CT renal stone study and VORB for 30mg  IV push toradol.

## 2017-11-25 NOTE — ED Triage Notes (Signed)
Pt to ED via POV, pt c/o abdominal pain, N/V. Pt states that she went out last night for her sister's birthday and drink a lot. Pt reports that she started vomiting around 0300 or 0400. Pt reports that she saw blood in the vomit this morning. Pt also started having diarrhea this morning as well.

## 2017-11-25 NOTE — ED Notes (Signed)
This RN to bedside due to call bell. Pt can be heard in room moaning and crying out. Pt requesting "something else for abdominal pain". No dry heaving noted at this time. Pt repeatedly calling out "Ow ow ow ow!"

## 2017-11-25 NOTE — ED Provider Notes (Signed)
[facility] Emergency Department Provider Note   ____________________________________________    I have reviewed the triage vital signs and the nursing notes.   HISTORY  Chief Complaint Abdominal Pain and Emesis     HPI Mercedes Page is a 27 y.o. female who presents with complaints of nausea vomiting which started this morning.  She complains of diffuse abdominal cramping which is not present this morning but has developed after multiple episodes of vomiting.  She reports that last night was her sister's birthday and she drink copious amounts of alcohol.  She woke up feeling very sick this morning and has been nauseated and vomiting since.  Denies drug use.  No history of abdominal surgery.   History reviewed. No pertinent past medical history.  There are no active problems to display for this patient.   Past Surgical History:  Procedure Laterality Date  . addenoidectomy    . TONSILLECTOMY      Prior to Admission medications   Medication Sig Start Date End Date Taking? Authorizing Provider  acetaminophen (TYLENOL) 500 MG tablet Take 500-1,000 mg by mouth every 6 (six) hours as needed for mild pain or moderate pain.    [provider]  diphenhydrAMINE (BENADRYL) 25 mg capsule Take 1 capsule (25 mg total) by mouth every 4 (four) hours as needed. 08/21/17 08/21/18  Enid Derry, PA-C  metroNIDAZOLE (FLAGYL) 500 MG tablet Take 1 tablet (500 mg total) by mouth 2 (two) times daily. 08/10/13   Samuel Jester, DO  naproxen (NAPROSYN) 250 MG tablet Take 1 tablet (250 mg total) by mouth 2 (two) times daily with a meal. 08/10/13   Samuel Jester, DO  naproxen (NAPROSYN) 500 MG tablet Take 1 tablet (500 mg total) by mouth 2 (two) times daily with a meal. 08/02/17   Joni Reining, PA-C  ondansetron (ZOFRAN ODT) 4 MG disintegrating tablet Take 1 tablet (4 mg total) by mouth every 8 (eight) hours as needed for nausea or vomiting. 11/25/17   Jene Every, MD  ondansetron (ZOFRAN) 4 MG tablet Take 1 tablet (4 mg total) by mouth every 8 (eight) hours as needed for nausea or vomiting. 08/10/13   Samuel Jester, DO  ondansetron (ZOFRAN) 4 MG tablet Take 1 tablet (4 mg total) by mouth daily as needed for nausea or vomiting. 12/17/16 12/17/17  Merrily Brittle, MD     Allergies Patient has no known allergies.  No family history on file.  Social History Social History   Tobacco Use  . Smoking status: Former Games developer  . Smokeless tobacco: Never Used  Substance Use Topics  . Alcohol use: Yes    Comment: occ  . Drug use: No    Review of Systems  Constitutional: No fevers Eyes: No visual changes.  ENT: No sore throat. Cardiovascular: Denies chest pain. Respiratory: Denies shortness of breath. Gastrointestinal: As above Genitourinary: Negative for dysuria. Musculoskeletal: Negative for back pain. Skin: Negative for rash. Neurological: Negative for headaches   ____________________________________________   PHYSICAL EXAM:  VITAL SIGNS: ED Triage Vitals  Enc Vitals Group     BP 11/25/17 1320 129/79     Pulse Rate 11/25/17 1320 93     Resp 11/25/17 1320 16     Temp 11/25/17 1320 98.8 F (37.1 C)     Temp Source 11/25/17 1320 Oral     SpO2 11/25/17 1320 99 %     Weight --      Height --      Head Circumference --  Peak Flow --      Pain Score 11/25/17 1330 10     Pain Loc --      Pain Edu? --      Excl. in GC? --     Constitutional: Alert and oriented. Eyes: Conjunctivae are normal.    Mouth/Throat: Mucous membranes are moist.    Cardiovascular: Normal rate, regular rhythm. Grossly normal heart sounds.  Good peripheral circulation. Respiratory: Normal respiratory effort.  No retractions. Lungs CTAB. Gastrointestinal: Soft, nontender, no distention, reassuring exam  Musculoskeletal: No lower extremity tenderness nor edema.  Warm and well perfused Neurologic:  Normal speech and language. No gross focal  neurologic deficits are appreciated.  Skin:  Skin is warm, dry and intact. No rash noted. Psychiatric: Mood and affect are normal. Speech and behavior are normal.  ____________________________________________   LABS (all labs ordered are listed, but only abnormal results are displayed)  Labs Reviewed  COMPREHENSIVE METABOLIC PANEL - Abnormal; Notable for the following components:      Result Value   Glucose, Bld 118 (*)    Total Protein 8.2 (*)    Total Bilirubin 1.3 (*)    All other components within normal limits  CBC - Abnormal; Notable for the following components:   WBC 16.8 (*)    All other components within normal limits  URINALYSIS, COMPLETE (UACMP) WITH MICROSCOPIC - Abnormal; Notable for the following components:   Color, Urine YELLOW (*)    APPearance CLOUDY (*)    Hgb urine dipstick MODERATE (*)    Leukocytes, UA MODERATE (*)    Bacteria, UA RARE (*)    All other components within normal limits  LIPASE, BLOOD  POC URINE PREG, ED   ____________________________________________  EKG  None ____________________________________________  RADIOLOGY  None ____________________________________________   PROCEDURES  Procedure(s) performed: No  Procedures   Critical Care performed: No ____________________________________________   INITIAL IMPRESSION / ASSESSMENT AND PLAN / ED COURSE  Pertinent labs & imaging results that were available during my care of the patient were reviewed by me and considered in my medical decision making (see chart for details).  Patient presents with nausea and vomiting status post alcohol consumption likely related to hangover.  Will treat with IV fluids, IV Zofran.  Lab work significant for elevated white blood cell count however I suspect this is related to her frequent vomiting.  Afebrile with normal abdominal exam  ----------------------------------------- 7:16 PM on  11/25/2017 -----------------------------------------  Patient CT scan given small amount of hematuria was negative for kidney stone.  Overall reassuring study.  She continues to complain of cramping in her abdomen, will give IM Bentyl to see if this helps.  She has received IV Toradol and IV Phenergan in addition to IV Zofran.  She reports her nausea is better  ----------------------------------------- 8:40 PM on 11/25/2017 -----------------------------------------  Patient feels significant better, she is ready to go home.  Will Rx Zofran.  Return precautions discussed    ____________________________________________   FINAL CLINICAL IMPRESSION(S) / ED DIAGNOSES  Final diagnoses:  Nausea vomiting and diarrhea  Gastroenteritis        Note:  This document was prepared using Dragon voice recognition software and may include unintentional dictation errors.    Jene Every, MD 11/25/17 2040

## 2017-11-25 NOTE — ED Notes (Signed)
Pt removed BP cuff and O2 sensor at this time.

## 2017-11-25 NOTE — ED Notes (Addendum)
MD notified that CT scan resulted. MD at bedside at this time. Pt continues to be crying out and moaning due to pain. Will continue to monitor for further patient needs.

## 2017-11-25 NOTE — ED Notes (Signed)
Patient transported to CT 

## 2018-04-02 ENCOUNTER — Emergency Department: Payer: BLUE CROSS/BLUE SHIELD

## 2018-04-02 ENCOUNTER — Other Ambulatory Visit: Payer: Self-pay

## 2018-04-02 ENCOUNTER — Emergency Department
Admission: EM | Admit: 2018-04-02 | Discharge: 2018-04-02 | Disposition: A | Discharge status: Home / Self Care | Payer: BLUE CROSS/BLUE SHIELD | Attending: Emergency Medicine | Admitting: Emergency Medicine

## 2018-04-02 ENCOUNTER — Encounter: Payer: Self-pay | Admitting: Emergency Medicine

## 2018-04-02 DIAGNOSIS — R1011 Right upper quadrant pain: Secondary | ICD-10-CM | POA: Diagnosis present

## 2018-04-02 DIAGNOSIS — R109 Unspecified abdominal pain: Secondary | ICD-10-CM

## 2018-04-02 DIAGNOSIS — Z87891 Personal history of nicotine dependence: Secondary | ICD-10-CM | POA: Insufficient documentation

## 2018-04-02 DIAGNOSIS — N3090 Cystitis, unspecified without hematuria: Secondary | ICD-10-CM | POA: Insufficient documentation

## 2018-04-02 DIAGNOSIS — Z79899 Other long term (current) drug therapy: Secondary | ICD-10-CM | POA: Insufficient documentation

## 2018-04-02 LAB — LIPASE, BLOOD: Lipase: 42 U/L (ref 11–51)

## 2018-04-02 LAB — URINALYSIS, COMPLETE (UACMP) WITH MICROSCOPIC
Bacteria, UA: NONE SEEN
Bilirubin Urine: NEGATIVE
Glucose, UA: NEGATIVE mg/dL
Ketones, ur: NEGATIVE mg/dL
Nitrite: NEGATIVE
Protein, ur: 30 mg/dL — AB
RBC / HPF: >50 RBC/hpf — ABNORMAL HIGH (ref 0–5)
Specific Gravity, Urine: 1.032 — ABNORMAL HIGH (ref 1.005–1.030)
WBC, UA: >50 WBC/hpf — ABNORMAL HIGH (ref 0–5)
pH: 6 (ref 5.0–8.0)

## 2018-04-02 LAB — COMPREHENSIVE METABOLIC PANEL
ALBUMIN: 4.2 g/dL (ref 3.5–5.0)
ALK PHOS: 55 U/L (ref 38–126)
ALT: 16 U/L (ref 0–44)
ANION GAP: 9 (ref 5–15)
AST: 17 U/L (ref 15–41)
BILIRUBIN TOTAL: 0.7 mg/dL (ref 0.3–1.2)
BUN: 11 mg/dL (ref 6–20)
CO2: 24 mmol/L (ref 22–32)
Calcium: 9.5 mg/dL (ref 8.9–10.3)
Chloride: 107 mmol/L (ref 98–111)
Creatinine, Ser: 0.86 mg/dL (ref 0.44–1.00)
GFR calc Af Amer: >60 mL/min (ref >60)
Glucose, Bld: 127 mg/dL — ABNORMAL HIGH (ref 70–99)
POTASSIUM: 3.9 mmol/L (ref 3.5–5.1)
Sodium: 140 mmol/L (ref 135–145)
Total Protein: 8.2 g/dL — ABNORMAL HIGH (ref 6.5–8.1)

## 2018-04-02 LAB — PREGNANCY, URINE: Preg Test, Ur: NEGATIVE

## 2018-04-02 LAB — CBC
HCT: 41.2 % (ref 36.0–46.0)
Hemoglobin: 13.5 g/dL (ref 12.0–15.0)
MCH: 30.1 pg (ref 26.0–34.0)
MCHC: 32.8 g/dL (ref 30.0–36.0)
MCV: 91.8 fL (ref 80.0–100.0)
Platelets: 419 10*3/uL — ABNORMAL HIGH (ref 150–400)
RBC: 4.49 MIL/uL (ref 3.87–5.11)
RDW: 12.4 % (ref 11.5–15.5)
WBC: 14.6 10*3/uL — ABNORMAL HIGH (ref 4.0–10.5)
nRBC: 0 % (ref 0.0–0.2)

## 2018-04-02 MED ORDER — CEPHALEXIN 500 MG PO CAPS: 500.0000 mg | ORAL_CAPSULE | Freq: Two times a day (BID) | ORAL | 0 refills | Status: DC

## 2018-04-02 MED ORDER — FOSFOMYCIN TROMETHAMINE 3 G PO PACK
3.0000 g | PACK | Freq: Once | ORAL | Status: AC
  Administered 2018-04-02: 3 g via ORAL
  Filled 2018-04-02: qty 3

## 2018-04-02 MED ORDER — ONDANSETRON 4 MG PO TBDP: 4.0000 mg | ORAL_TABLET | Freq: Three times a day (TID) | ORAL | 0 refills | Status: DC | PRN

## 2018-04-02 NOTE — ED Provider Notes (Signed)
[facility] Emergency Department Provider Note  ____________________________________________  Time seen: Approximately 5:55 AM  I have reviewed the triage vital signs and the nursing notes.   HISTORY  Chief Complaint Abdominal Pain    HPI Mercedes Page is a 28 y.o. female with no significant past medical history who complains of right upper quadrant abdominal pain for the past 3 days, gradual onset, intermittent, worse lying on her side better sitting upright.  Normal eating and drinking, no vomiting diarrhea or constipation.  Denies fevers chills or sweats.  No dysuria or urinary frequency that she recalls.  No vaginal bleeding or discharge.      History reviewed. No pertinent past medical history.   There are no active problems to display for this patient.    Past Surgical History:  Procedure Laterality Date  . addenoidectomy    . TONSILLECTOMY       Prior to Admission medications   Medication Sig Start Date End Date Taking? Authorizing Provider  acetaminophen (TYLENOL) 500 MG tablet Take 500-1,000 mg by mouth every 6 (six) hours as needed for mild pain or moderate pain.   Yes [provider]  diphenhydrAMINE (BENADRYL) 25 mg capsule Take 1 capsule (25 mg total) by mouth every 4 (four) hours as needed. 08/21/17 08/21/18 Yes Enid Derry, PA-C  naproxen (NAPROSYN) 500 MG tablet Take 1 tablet (500 mg total) by mouth 2 (two) times daily with a meal. 08/02/17  Yes Joni Reining, PA-C  cephALEXin (KEFLEX) 500 MG capsule Take 1 capsule (500 mg total) by mouth 2 (two) times daily. 04/02/18   Sharman Cheek, MD  ondansetron (ZOFRAN ODT) 4 MG disintegrating tablet Take 1 tablet (4 mg total) by mouth every 8 (eight) hours as needed for nausea or vomiting. 04/02/18   Sharman Cheek, MD     Allergies Patient has no known allergies.   History reviewed. No pertinent family history.  Social History Social History   Tobacco Use  . Smoking  status: Former Games developer  . Smokeless tobacco: Never Used  Substance Use Topics  . Alcohol use: Yes    Comment: occ  . Drug use: No    Review of Systems  Constitutional:   No fever or chills.  ENT:   No sore throat. No rhinorrhea. Cardiovascular:   No chest pain or syncope. Respiratory:   No dyspnea or cough. Gastrointestinal: Positive as above for abdominal pain without vomiting and diarrhea.  Musculoskeletal:   Negative for focal pain or swelling All other systems reviewed and are negative except as documented above in ROS and HPI.  ____________________________________________   PHYSICAL EXAM:  VITAL SIGNS: ED Triage Vitals  Enc Vitals Group     BP 04/02/18 0032 (!) 141/96     Pulse Rate 04/02/18 0032 88     Resp 04/02/18 0032 16     Temp 04/02/18 0032 98.4 F (36.9 C)     Temp Source 04/02/18 0032 Oral     SpO2 04/02/18 0032 100 %     Weight 04/02/18 0033 207 lb (93.9 kg)     Height 04/02/18 0033 5\' 6"  (1.676 m)     Head Circumference --      Peak Flow --      Pain Score 04/02/18 0032 9     Pain Loc --      Pain Edu? --      Excl. in GC? --     Vital signs reviewed, nursing assessments reviewed.   Constitutional:  Alert and oriented. Non-toxic appearance. Eyes:   Conjunctivae are normal. EOMI. PERRL. ENT      Head:   Normocephalic and atraumatic.      Nose:   No congestion/rhinnorhea.       Mouth/Throat:   MMM, no pharyngeal erythema. No peritonsillar mass.       Neck:   No meningismus. Full ROM. Hematological/Lymphatic/Immunilogical:   No cervical lymphadenopathy. Cardiovascular:   RRR. Symmetric bilateral radial and DP pulses.  No murmurs. Cap refill less than 2 seconds. Respiratory:   Normal respiratory effort without tachypnea/retractions. Breath sounds are clear and equal bilaterally. No wheezes/rales/rhonchi. Gastrointestinal:   Soft and nontender. Non distended. There is no CVA tenderness.  No rebound, rigidity, or guarding. Musculoskeletal:   Normal  range of motion in all extremities. No joint effusions.  No lower extremity tenderness.  No edema. Neurologic:   Normal speech and language.  Motor grossly intact. No acute focal neurologic deficits are appreciated.  Skin:    Skin is warm, dry and intact. No rash noted.  No petechiae, purpura, or bullae.  ____________________________________________    LABS (pertinent positives/negatives) (all labs ordered are listed, but only abnormal results are displayed) Labs Reviewed  COMPREHENSIVE METABOLIC PANEL - Abnormal; Notable for the following components:      Result Value   Glucose, Bld 127 (*)    Total Protein 8.2 (*)    All other components within normal limits  CBC - Abnormal; Notable for the following components:   WBC 14.6 (*)    Platelets 419 (*)    All other components within normal limits  URINALYSIS, COMPLETE (UACMP) WITH MICROSCOPIC - Abnormal; Notable for the following components:   Color, Urine YELLOW (*)    APPearance CLOUDY (*)    Specific Gravity, Urine 1.032 (*)    Hgb urine dipstick MODERATE (*)    Protein, ur 30 (*)    Leukocytes,Ua LARGE (*)    RBC / HPF >50 (*)    WBC, UA >50 (*)    All other components within normal limits  URINE CULTURE  LIPASE, BLOOD  PREGNANCY, URINE  POC URINE PREG, ED   ____________________________________________   EKG    ____________________________________________    RADIOLOGY  Dg Abdomen 1 View  Result Date: 04/02/2018 CLINICAL DATA:  Right upper quadrant pain for 3 days EXAM: ABDOMEN - 1 VIEW COMPARISON:  11/25/2017 abdominal CT FINDINGS: The bowel gas pattern is normal. No abnormal stool retention. No radio-opaque calculi or other significant radiographic abnormality are seen. IMPRESSION: Negative. Electronically Signed   By: Marnee Spring M.D.   On: 04/02/2018 05:12   US Abdomen Limited Ruq  Result Date: 04/02/2018 CLINICAL DATA:  Abdominal pain for 2 days. EXAM: ULTRASOUND ABDOMEN LIMITED RIGHT UPPER QUADRANT  COMPARISON:  CT, 11/25/2017 FINDINGS: Gallbladder: No gallstones or wall thickening visualized. No sonographic Murphy sign noted by sonographer. Common bile duct: Diameter: 3 mm Liver: No focal lesion identified. Within normal limits in parenchymal echogenicity. Portal vein is patent on color Doppler imaging with normal direction of blood flow towards the liver. IMPRESSION: Normal right upper quadrant ultrasound. Electronically Signed   By: Amie Portland M.D.   On: 04/02/2018 01:32    ____________________________________________   PROCEDURES Procedures  ____________________________________________  DIFFERENTIAL DIAGNOSIS   Cholecystitis, cholelithiasis, pancreatitis, choledocholithiasis, bowel obstruction, ureterolithiasis, cystitis  CLINICAL IMPRESSION / ASSESSMENT AND PLAN / ED COURSE  Medications ordered in the ED: Medications  fosfomycin (MONUROL) packet 3 g (3 g Oral Given 04/02/18 0532)  Pertinent labs & imaging results that were available during my care of the patient were reviewed by me and considered in my medical decision making (see chart for details).    Patient presents with right upper quadrant pain, intermittent and atypical.  Exam is benign and reassuring.  Work-up reveals a leukocytosis of 14,000 and urinalysis strongly consistent with a UTI with large white blood cells and leukocytes.  With these findings, I will start her on antibiotics for UTI spite lack of symptoms.  KUB obtained to ensure no signs of large kidney stone or bowel obstruction, x-rays negative.  Stable for discharge home.  She is nontoxic, not septic, pain currently resolved.  Doubt STI PID TOA or torsion.      ____________________________________________   FINAL CLINICAL IMPRESSION(S) / ED DIAGNOSES    Final diagnoses:  Abdominal pain  Right upper quadrant abdominal pain  Cystitis     ED Discharge Orders         Ordered    cephALEXin (KEFLEX) 500 MG capsule  2 times daily      04/02/18 0555    ondansetron (ZOFRAN ODT) 4 MG disintegrating tablet  Every 8 hours PRN     04/02/18 0555          Portions of this note were generated with dragon dictation software. Dictation errors may occur despite best attempts at proofreading.   Sharman Cheek, MD 04/02/18 [phone]

## 2018-04-02 NOTE — ED Triage Notes (Signed)
Pt c/o right upper quadrant pain for a couple of days; denies N/V/D; pain is constant now, worse when laying down; sharp pain

## 2018-04-02 NOTE — ED Notes (Signed)
No peripheral IV placed this visit.   Discharge instructions reviewed with patient. Questions fielded by this RN. Patient verbalizes understanding of instructions. Patient discharged home in stable condition per stafford. No acute distress noted at time of discharge.   

## 2018-04-02 NOTE — ED Notes (Addendum)
RUQ pain for the last 3 days, worse with side lying relieved slightly by sitting up, 6 -8/10 pain sharp  Only surg T&A, last BM yesterday "normal"  Pt denies freq or dysuria,

## 2018-04-03 LAB — URINE CULTURE

## 2018-09-15 ENCOUNTER — Encounter: Payer: Self-pay | Admitting: Emergency Medicine

## 2018-09-15 ENCOUNTER — Emergency Department
Admission: EM | Admit: 2018-09-15 | Discharge: 2018-09-15 | Disposition: A | Discharge status: Home / Self Care | Payer: BC Managed Care – PPO | Attending: Emergency Medicine | Admitting: Emergency Medicine

## 2018-09-15 ENCOUNTER — Other Ambulatory Visit: Payer: Self-pay

## 2018-09-15 DIAGNOSIS — R197 Diarrhea, unspecified: Secondary | ICD-10-CM | POA: Insufficient documentation

## 2018-09-15 DIAGNOSIS — Z79899 Other long term (current) drug therapy: Secondary | ICD-10-CM | POA: Insufficient documentation

## 2018-09-15 DIAGNOSIS — R112 Nausea with vomiting, unspecified: Secondary | ICD-10-CM

## 2018-09-15 LAB — URINALYSIS, COMPLETE (UACMP) WITH MICROSCOPIC
Bacteria, UA: NONE SEEN
Bilirubin Urine: NEGATIVE
Glucose, UA: NEGATIVE mg/dL
Hgb urine dipstick: NEGATIVE
Ketones, ur: 20 mg/dL — AB
Nitrite: NEGATIVE
Protein, ur: 30 mg/dL — AB
Specific Gravity, Urine: 1.024 (ref 1.005–1.030)
pH: 7 (ref 5.0–8.0)

## 2018-09-15 LAB — COMPREHENSIVE METABOLIC PANEL
ALT: 19 U/L (ref 0–44)
AST: 21 U/L (ref 15–41)
Albumin: 4.4 g/dL (ref 3.5–5.0)
Alkaline Phosphatase: 51 U/L (ref 38–126)
Anion gap: 8 (ref 5–15)
BUN: 10 mg/dL (ref 6–20)
CO2: 24 mmol/L (ref 22–32)
Calcium: 9.4 mg/dL (ref 8.9–10.3)
Chloride: 110 mmol/L (ref 98–111)
Creatinine, Ser: 0.67 mg/dL (ref 0.44–1.00)
GFR calc Af Amer: >60 mL/min (ref >60)
GFR calc non Af Amer: >60 mL/min (ref >60)
Glucose, Bld: 129 mg/dL — ABNORMAL HIGH (ref 70–99)
Potassium: 3.6 mmol/L (ref 3.5–5.1)
Sodium: 142 mmol/L (ref 135–145)
Total Bilirubin: 1.1 mg/dL (ref 0.3–1.2)
Total Protein: 8 g/dL (ref 6.5–8.1)

## 2018-09-15 LAB — CBC
HCT: 40.1 % (ref 36.0–46.0)
Hemoglobin: 13.4 g/dL (ref 12.0–15.0)
MCH: 30 pg (ref 26.0–34.0)
MCHC: 33.4 g/dL (ref 30.0–36.0)
MCV: 89.7 fL (ref 80.0–100.0)
Platelets: 391 10*3/uL (ref 150–400)
RBC: 4.47 MIL/uL (ref 3.87–5.11)
RDW: 12.6 % (ref 11.5–15.5)
WBC: 15.1 10*3/uL — ABNORMAL HIGH (ref 4.0–10.5)
nRBC: 0 % (ref 0.0–0.2)

## 2018-09-15 LAB — PREGNANCY, URINE: Preg Test, Ur: NEGATIVE

## 2018-09-15 LAB — LIPASE, BLOOD: Lipase: 32 U/L (ref 11–51)

## 2018-09-15 MED ORDER — PROMETHAZINE HCL 25 MG/ML IJ SOLN
25.0000 mg | Freq: Once | INTRAMUSCULAR | Status: AC
  Administered 2018-09-15: 25 mg via INTRAVENOUS
  Filled 2018-09-15: qty 1

## 2018-09-15 MED ORDER — ONDANSETRON HCL 4 MG/2ML IJ SOLN: 4.0000 mg | Freq: Once | INTRAMUSCULAR | Status: DC

## 2018-09-15 MED ORDER — LORAZEPAM 2 MG/ML IJ SOLN
1.0000 mg | Freq: Once | INTRAMUSCULAR | Status: AC
  Administered 2018-09-15: 1 mg via INTRAVENOUS
  Filled 2018-09-15: qty 1

## 2018-09-15 MED ORDER — SODIUM CHLORIDE 0.9% FLUSH: 3.0000 mL | Freq: Once | INTRAVENOUS | Status: DC

## 2018-09-15 MED ORDER — SODIUM CHLORIDE 0.9 % IV BOLUS
1000.0000 mL | Freq: Once | INTRAVENOUS | Status: AC
  Administered 2018-09-15: 1000 mL via INTRAVENOUS

## 2018-09-15 MED ORDER — HALOPERIDOL LACTATE 5 MG/ML IJ SOLN
5.0000 mg | Freq: Once | INTRAMUSCULAR | Status: AC
  Administered 2018-09-15: 5 mg via INTRAVENOUS
  Filled 2018-09-15: qty 1

## 2018-09-15 MED ORDER — DICYCLOMINE HCL 20 MG PO TABS: 20.0000 mg | ORAL_TABLET | Freq: Three times a day (TID) | ORAL | 0 refills | Status: DC | PRN

## 2018-09-15 MED ORDER — LOPERAMIDE HCL 2 MG PO CAPS
4.0000 mg | ORAL_CAPSULE | Freq: Once | ORAL | Status: AC
  Administered 2018-09-15: 4 mg via ORAL
  Filled 2018-09-15: qty 2

## 2018-09-15 MED ORDER — ONDANSETRON 4 MG PO TBDP: 4.0000 mg | ORAL_TABLET | Freq: Three times a day (TID) | ORAL | 0 refills | Status: DC | PRN

## 2018-09-15 NOTE — ED Notes (Signed)
Pt states she began having N/V and diarrhea at 0400 today. C/o pain in mid abdomen. States the last time she felt like this it was from "drinking too much" and she "drank a lot of alcohol last night".

## 2018-09-15 NOTE — ED Provider Notes (Signed)
[facility] Emergency Department Provider Note  Time seen: 11:03 AM  I have reviewed the triage vital signs and the nursing notes.   HISTORY  Chief Complaint Abdominal Pain   HPI Mercedes Page is a 28 y.o. female with no past medical history presents to the emergency department for nausea vomiting diarrhea.  According to the patient she was drinking a significant amount of alcohol yesterday and did not eat much per patient.  States she also smoked marijuana late last night and woke up around 4:00 this morning with nausea vomiting and diarrhea.  Patient states she has had persistent nausea vomiting and diarrhea with abdominal cramping since 4 AM this morning.  Denies any fever cough congestion or shortness of breath.  No chest pain.   History reviewed. No pertinent past medical history.  There are no active problems to display for this patient.   Past Surgical History:  Procedure Laterality Date  . addenoidectomy    . TONSILLECTOMY      Prior to Admission medications   Medication Sig Start Date End Date Taking? Authorizing Provider  acetaminophen (TYLENOL) 500 MG tablet Take 500-1,000 mg by mouth every 6 (six) hours as needed for mild pain or moderate pain.    [provider]  cephALEXin (KEFLEX) 500 MG capsule Take 1 capsule (500 mg total) by mouth 2 (two) times daily. 04/02/18   Carrie Mew, MD  diphenhydrAMINE (BENADRYL) 25 mg capsule Take 1 capsule (25 mg total) by mouth every 4 (four) hours as needed. 08/21/17 08/21/18  Laban Emperor, PA-C  naproxen (NAPROSYN) 500 MG tablet Take 1 tablet (500 mg total) by mouth 2 (two) times daily with a meal. 08/02/17   Sable Feil, PA-C  ondansetron (ZOFRAN ODT) 4 MG disintegrating tablet Take 1 tablet (4 mg total) by mouth every 8 (eight) hours as needed for nausea or vomiting. 04/02/18   Carrie Mew, MD    No Known Allergies  No family history on file.  Social History Social History    Tobacco Use  . Smoking status: Former Research scientist (life sciences)  . Smokeless tobacco: Never Used  Substance Use Topics  . Alcohol use: Yes    Comment: occ  . Drug use: Yes    Types: Marijuana    Comment: smoked yesterday     Review of Systems Constitutional: Negative for fever. Cardiovascular: Negative for chest pain. Respiratory: Negative for shortness of breath. Gastrointestinal: Abdominal cramping.  Positive for nausea vomiting diarrhea. Genitourinary: Negative for urinary compaints Musculoskeletal: Negative for musculoskeletal complaints Skin: Negative for skin complaints  Neurological: Negative for headache All other ROS negative  ____________________________________________   PHYSICAL EXAM:  VITAL SIGNS: ED Triage Vitals  Enc Vitals Group     BP 09/15/18 0801 (!) 138/101     Pulse Rate 09/15/18 0801 80     Resp 09/15/18 0801 16     Temp 09/15/18 0801 98.1 F (36.7 C)     Temp Source 09/15/18 0801 Oral     SpO2 09/15/18 0801 99 %     Weight 09/15/18 0802 213 lb (96.6 kg)     Height 09/15/18 0802 5\' 6"  (1.676 m)     Head Circumference --      Peak Flow --      Pain Score 09/15/18 0801 10     Pain Loc --      Pain Edu? --      Excl. in [place]? --    Constitutional: Alert and oriented.  Patient is moaning  in bed states she feels very nauseous. Eyes: Normal exam ENT      Head: Normocephalic and atraumatic.      Mouth/Throat: Mucous membranes are moist. Cardiovascular: Normal rate, regular rhythm. Respiratory: Normal respiratory effort without tachypnea nor retractions. Breath sounds are clear  Gastrointestinal: Soft, mild diffuse tenderness without focal tenderness identified.  No rebound guarding or distention. Musculoskeletal: Nontender with normal range of motion in all extremities.  Neurologic:  Normal speech and language. No gross focal neurologic deficits Skin:  Skin is warm, dry and intact.  Psychiatric: Mood and affect are normal      INITIAL IMPRESSION /  ASSESSMENT AND PLAN / ED COURSE  Pertinent labs & imaging results that were available during my care of the patient were reviewed by me and considered in my medical decision making (see chart for details).   Patient presents to the emergency department for nausea vomiting diarrhea since 4:00 this morning.  Patient does admit to drinking a significant amount of alcohol last night and smoking marijuana.  States she has had similar episodes in the past after drinking alcohol and smoking marijuana.  Differential would include gastritis, cyclical vomiting or cannabinoid induced vomiting, intra-abdominal pathology, gastroenteritis.  We will check labs, treat nausea IV hydrate and continue to closely monitor while awaiting results.  Patient's labs show a leukocytosis of 15,000 otherwise largely within normal limits.  Patient received Phenergan continued to feel nauseated and received Ativan.  Patient states she felt better for some time and then felt nauseated once again with diffuse abdominal cramping.  Patient dosed Haldol.  Shortly afterwards patient states she is feeling much better but she is somnolent.  We will monitor in the emergency department finished IV fluids and continue to closely watch.  If the patient feels improved we will likely discharge home, if worsening patient may require further work-up.  Patient care signed out to oncoming physician.  Mercedes Page was evaluated in Emergency Department on 09/15/2018 for the symptoms described in the history of present illness. She was evaluated in the context of the global COVID-19 pandemic, which necessitated consideration that the patient might be at risk for infection with the SARS-CoV-2 virus that causes COVID-19. Institutional protocols and algorithms that pertain to the evaluation of patients at risk for COVID-19 are in a state of rapid change based on information released by regulatory bodies including the CDC and federal and state organizations.  These policies and algorithms were followed during the patient's care in the ED.  ____________________________________________   FINAL CLINICAL IMPRESSION(S) / ED DIAGNOSES  Nausea vomiting diarrhea   Mercedes Page, Thorvald Orsino, MD 09/16/18 [phone]

## 2018-09-15 NOTE — ED Notes (Signed)
Pt ambulated to the bathroom, hit call light, when this RN walked in pt was laying on the floor.  There was some yellow emesis noted on the floor and in the toilet.  Myself and the female [employer] officer went in to see patient, pt able to get into wheelchair on her own.  PT given emesis bag and wheeled back to the lobby. Pt now walking swiftly back to the bathroom.

## 2018-09-15 NOTE — ED Notes (Signed)
Pt heard in lobby bathroom, yelling, making loud noises, went into bathroom pt sitting on the floor, asked patient to please keep it down, pt states "I can't i'm hurting" and "I'm sick"  advised patient that we are working on getting her into a room, however, there are other patients that have been waiting. Pt has clear yellow emesis noted on the floor in 2 of the 3 bathroom stalls.

## 2018-09-15 NOTE — Discharge Instructions (Signed)
Please take your medications as needed, as prescribed.  As we discussed please return to the emergency department for any worsening abdominal pain, fever, or any other symptom personally concerning to yourself.  Otherwise please follow-up with your doctor in the next several days for recheck/reevaluation.

## 2018-09-15 NOTE — ED Notes (Signed)
Pt resting more comfortably at this time

## 2018-09-15 NOTE — ED Notes (Signed)
Pt given another warm blanket by Nicki Reaper, EDT

## 2018-09-15 NOTE — ED Notes (Signed)
Pt ambulatory to bathroom without difficulty.  

## 2018-09-15 NOTE — ED Triage Notes (Signed)
Pt to ED via POV c/o generalized abdominal pain. Pt states that the pain started this morning around 0400. Pt states that she has also been vomiting yellow bile. Pt reports that she thinks she may have ate too much yesterday. Pt reports that she last vomited when she came into ED. Pt is in NAD at this time.

## 2018-09-15 NOTE — ED Notes (Signed)
Pt ambulatory to registration desk, pt asking for water, informed her that she cannot have anything to eat or drink until seen by the provider. Pt states "I feel like i'm going to pass out" Pt given wheelchair, however, when patient sat down she said she had to go to bathroom due to having vomiting and diarrhea. Offered emesis bag but pt declined. Pt ambulatory to bathroom without assistance. Steady gait.

## 2018-09-15 NOTE — ED Provider Notes (Signed)
-----------------------------------------   2:41 PM on 09/15/2018 -----------------------------------------  Blood pressure 122/87, pulse 81, temperature 98.1 F (36.7 C), temperature source Oral, resp. rate 18, height 5\' 6"  (1.676 m), weight 96.6 kg, SpO2 100 %.  Assuming care from Dr. Kerman Passey.  In short, Mercedes Page is a 28 y.o. female with a chief complaint of Abdominal Pain .  Refer to the original H&P for additional details.  The current plan of care is to follow-up following additional symptomatic treatment with haldol. Patient with unremarkable workup thus far.  Patient reports feeling better after Haldol, abdominal exam is reassuring with no tenderness whatsoever.  Patient appropriate for discharge home, counseled to follow-up with PCP and return to the ED for new or worsening symptoms.  Patient agrees with plan.    Blake Divine, MD 09/15/18 [phone]

## 2020-07-30 IMAGING — US ULTRASOUND ABDOMEN LIMITED
1 series · 14 of 25 positions shown · non-contrast
Comparison: CT, 11/25/2017

CLINICAL DATA: Abdominal pain for 2 days.

EXAM:
ULTRASOUND ABDOMEN LIMITED RIGHT UPPER QUADRANT

[Series 1: ultrasound abdomen limited · 14 of 48 slices shown]
[im 1/48]
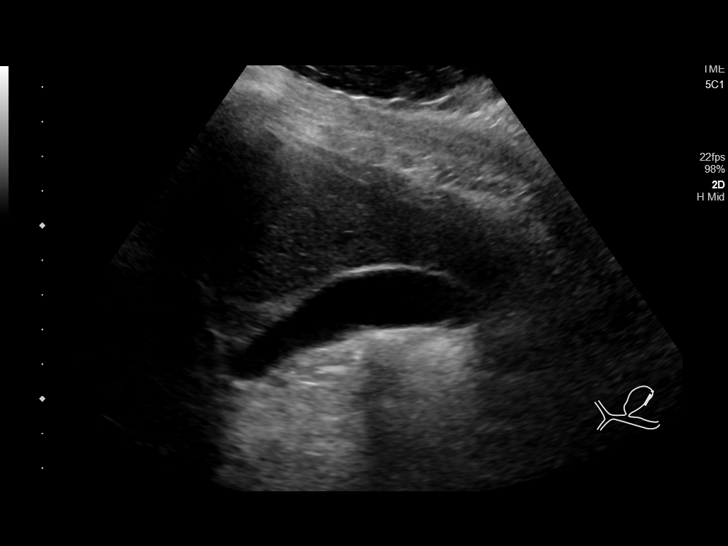
[im 4/48]
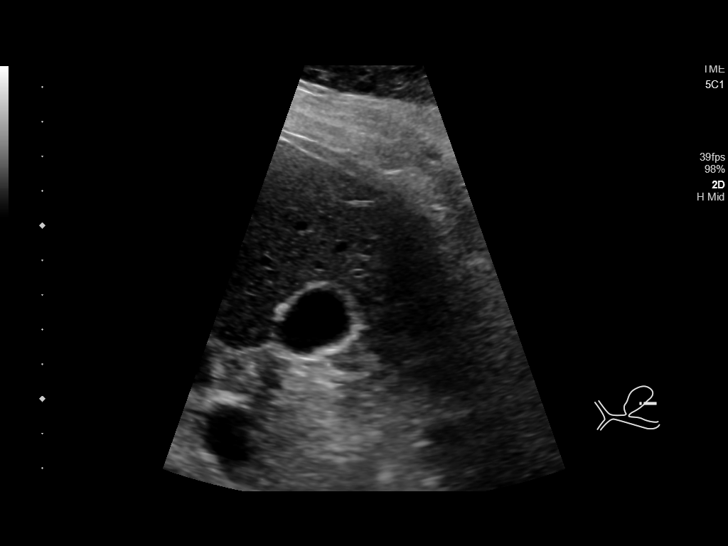
[im 8/48]
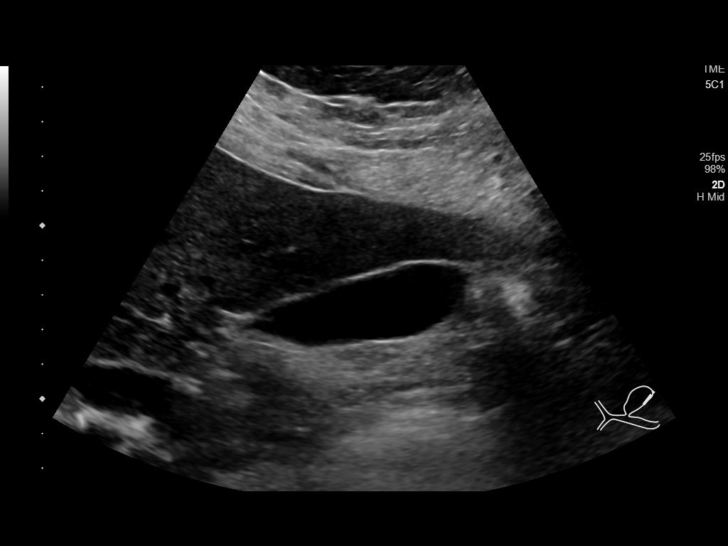
[im 12/48]
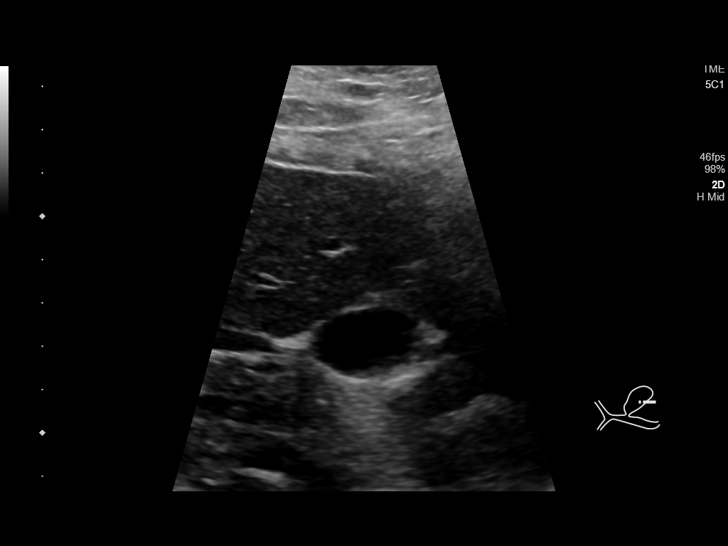
[im 16/48]
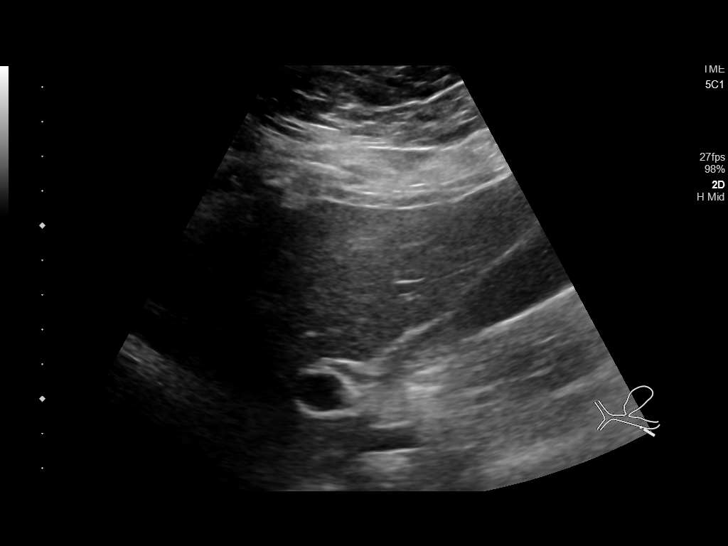
[im 18/48]
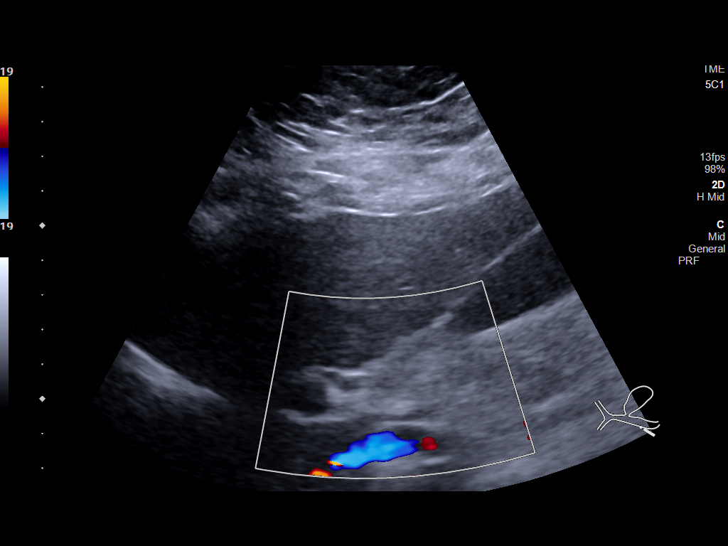
[im 22/48]
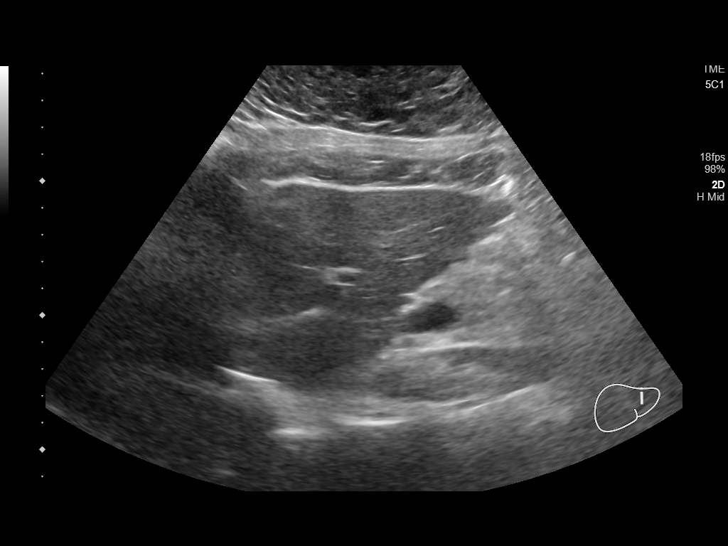
[im 26/48]
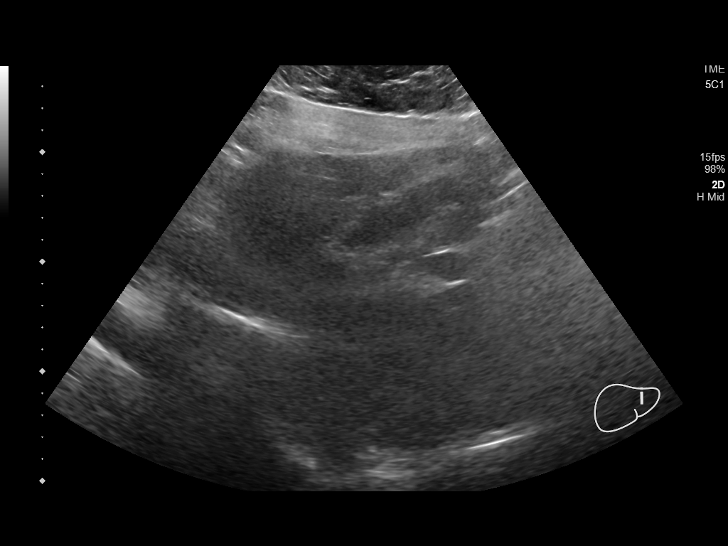
[im 30/48]
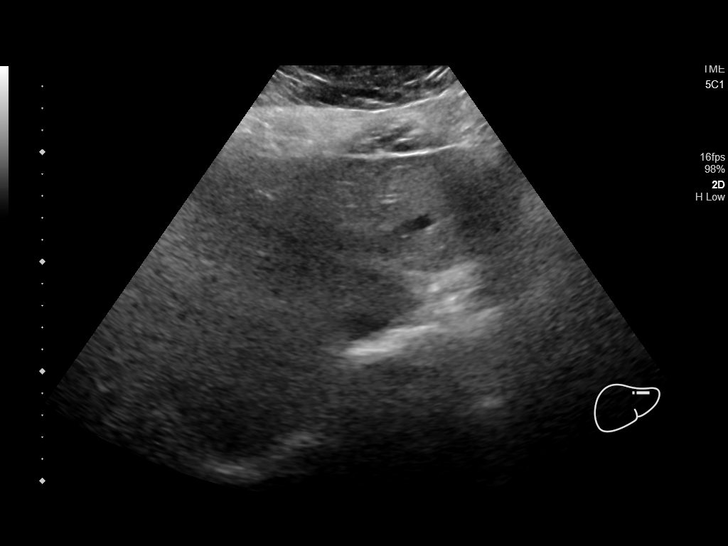
[im 32/48]
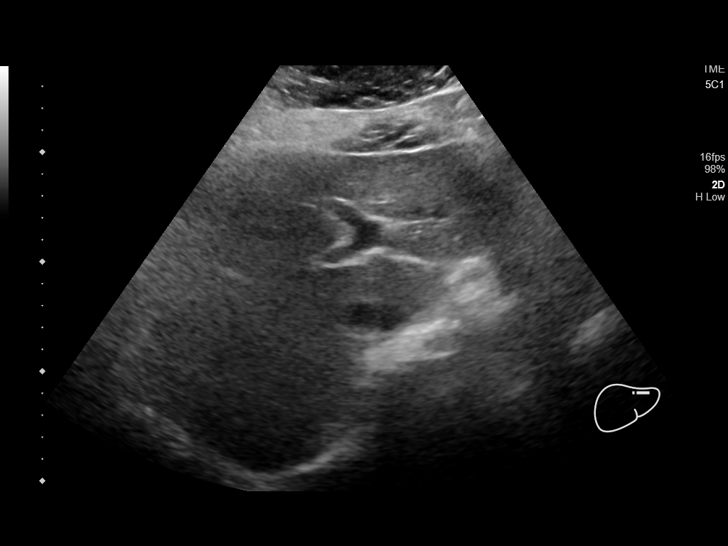
[im 36/48]
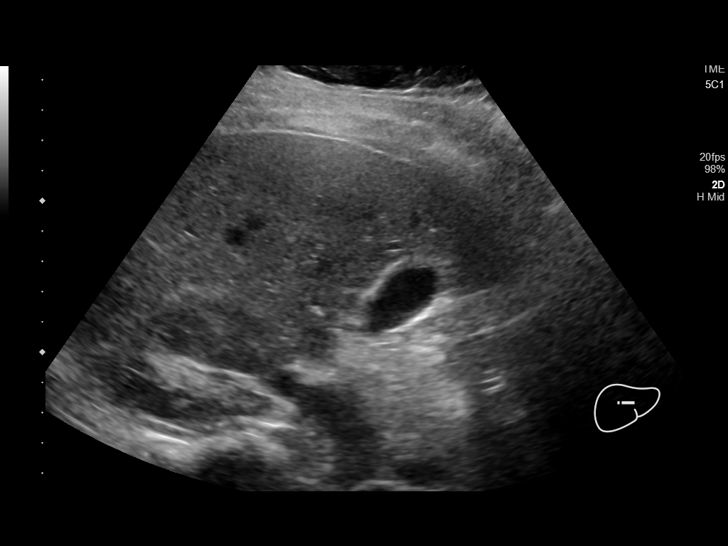
[im 40/48]
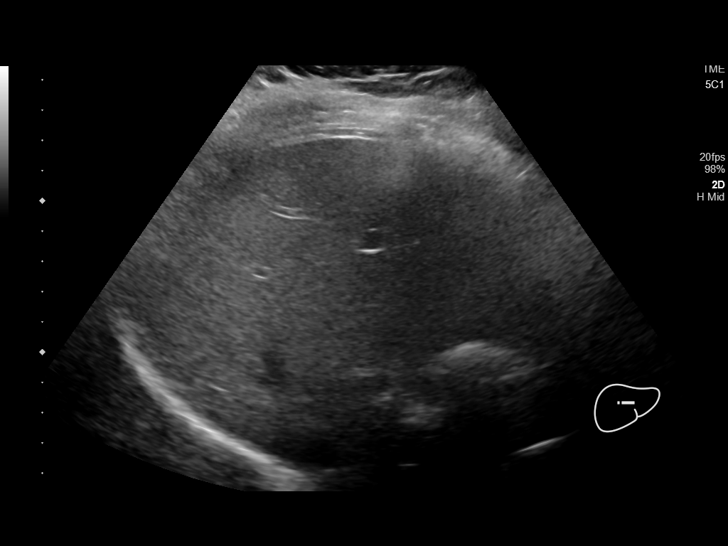
[im 44/48]
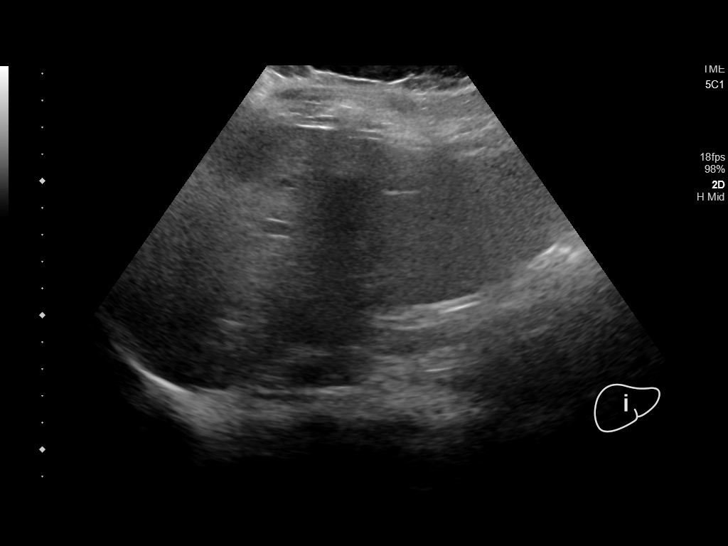
[im 48/48]
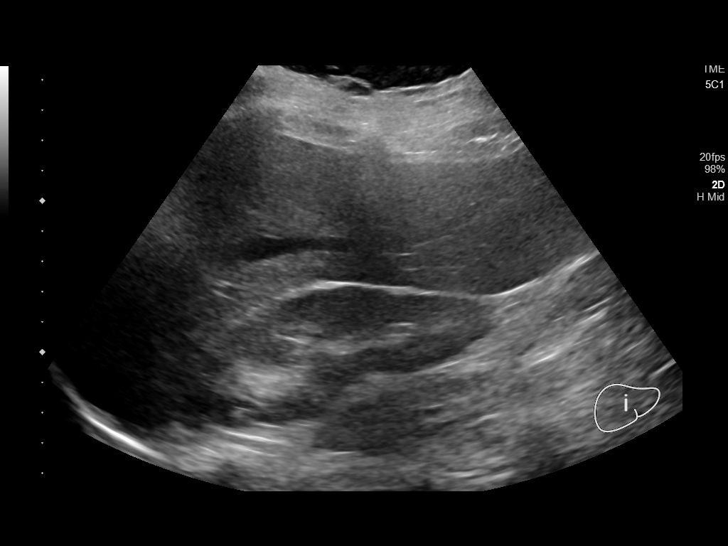

[14 of 25 positions shown; findings below may reference images not displayed]

FINDINGS: Gallbladder:

No gallstones or wall thickening visualized. No sonographic Murphy
sign noted by sonographer.

Common bile duct:

Diameter: 3 mm

Liver:

No focal lesion identified. Within normal limits in parenchymal
echogenicity. Portal vein is patent on color Doppler imaging with
normal direction of blood flow towards the liver.
IMPRESSION: Normal right upper quadrant ultrasound.

## 2021-09-18 ENCOUNTER — Other Ambulatory Visit: Payer: Self-pay

## 2021-09-18 ENCOUNTER — Emergency Department
Admission: EM | Admit: 2021-09-18 | Discharge: 2021-09-18 | Disposition: A | Discharge status: Home / Self Care | Payer: PRIVATE HEALTH INSURANCE | Attending: Emergency Medicine | Admitting: Emergency Medicine

## 2021-09-18 ENCOUNTER — Emergency Department: Payer: PRIVATE HEALTH INSURANCE

## 2021-09-18 DIAGNOSIS — Z20822 Contact with and (suspected) exposure to covid-19: Secondary | ICD-10-CM | POA: Diagnosis not present

## 2021-09-18 DIAGNOSIS — R112 Nausea with vomiting, unspecified: Secondary | ICD-10-CM | POA: Diagnosis present

## 2021-09-18 DIAGNOSIS — K529 Noninfective gastroenteritis and colitis, unspecified: Secondary | ICD-10-CM | POA: Insufficient documentation

## 2021-09-18 DIAGNOSIS — N3000 Acute cystitis without hematuria: Secondary | ICD-10-CM | POA: Insufficient documentation

## 2021-09-18 LAB — COMPREHENSIVE METABOLIC PANEL
ALT: 32 U/L (ref 0–44)
AST: 25 U/L (ref 15–41)
Albumin: 4.4 g/dL (ref 3.5–5.0)
Alkaline Phosphatase: 46 U/L (ref 38–126)
Anion gap: 7 (ref 5–15)
BUN: 14 mg/dL (ref 6–20)
CO2: 23 mmol/L (ref 22–32)
Calcium: 9.1 mg/dL (ref 8.9–10.3)
Chloride: 109 mmol/L (ref 98–111)
Creatinine, Ser: 0.71 mg/dL (ref 0.44–1.00)
GFR, Estimated: >60 mL/min (ref >60)
Glucose, Bld: 149 mg/dL — ABNORMAL HIGH (ref 70–99)
Potassium: 3.5 mmol/L (ref 3.5–5.1)
Sodium: 139 mmol/L (ref 135–145)
Total Bilirubin: 0.7 mg/dL (ref 0.3–1.2)
Total Protein: 8.1 g/dL (ref 6.5–8.1)

## 2021-09-18 LAB — CBC
HCT: 39.2 % (ref 36.0–46.0)
Hemoglobin: 12.8 g/dL (ref 12.0–15.0)
MCH: 30 pg (ref 26.0–34.0)
MCHC: 32.7 g/dL (ref 30.0–36.0)
MCV: 92 fL (ref 80.0–100.0)
Platelets: 340 10*3/uL (ref 150–400)
RBC: 4.26 MIL/uL (ref 3.87–5.11)
RDW: 12.4 % (ref 11.5–15.5)
WBC: 16.4 10*3/uL — ABNORMAL HIGH (ref 4.0–10.5)
nRBC: 0 % (ref 0.0–0.2)

## 2021-09-18 LAB — RESP PANEL BY RT-PCR (FLU A&B, COVID) ARPGX2
Influenza A by PCR: NEGATIVE
Influenza B by PCR: NEGATIVE
SARS Coronavirus 2 by RT PCR: NEGATIVE

## 2021-09-18 LAB — URINALYSIS, ROUTINE W REFLEX MICROSCOPIC
Bilirubin Urine: NEGATIVE
Glucose, UA: NEGATIVE mg/dL
Hgb urine dipstick: NEGATIVE
Ketones, ur: NEGATIVE mg/dL
Leukocytes,Ua: NEGATIVE
Nitrite: NEGATIVE
Protein, ur: 30 mg/dL — AB
Specific Gravity, Urine: 1.024 (ref 1.005–1.030)
WBC, UA: NONE SEEN WBC/hpf (ref 0–5)
pH: 9 — ABNORMAL HIGH (ref 5.0–8.0)

## 2021-09-18 LAB — LIPASE, BLOOD: Lipase: 29 U/L (ref 11–51)

## 2021-09-18 LAB — HCG, QUANTITATIVE, PREGNANCY: hCG, Beta Chain, Quant, S: <1 m[IU]/mL (ref <5)

## 2021-09-18 LAB — POC URINE PREG, ED: Preg Test, Ur: NEGATIVE

## 2021-09-18 MED ORDER — DROPERIDOL 2.5 MG/ML IJ SOLN
2.5000 mg | Freq: Once | INTRAMUSCULAR | Status: AC
  Administered 2021-09-18: 2.5 mg via INTRAVENOUS
  Filled 2021-09-18: qty 2

## 2021-09-18 MED ORDER — CEFDINIR 300 MG PO CAPS: 300.0000 mg | ORAL_CAPSULE | Freq: Two times a day (BID) | ORAL | 0 refills | Status: AC

## 2021-09-18 MED ORDER — SODIUM CHLORIDE 0.9 % IV SOLN
12.5000 mg | Freq: Once | INTRAVENOUS | Status: AC
  Administered 2021-09-18: 12.5 mg via INTRAVENOUS
  Filled 2021-09-18 (×2): qty 0.5

## 2021-09-18 MED ORDER — PANTOPRAZOLE SODIUM 40 MG PO TBEC: 40.0000 mg | DELAYED_RELEASE_TABLET | Freq: Every day | ORAL | 0 refills | Status: DC

## 2021-09-18 MED ORDER — METOCLOPRAMIDE HCL 5 MG/ML IJ SOLN
10.0000 mg | Freq: Once | INTRAMUSCULAR | Status: AC
  Administered 2021-09-18: 10 mg via INTRAVENOUS
  Filled 2021-09-18: qty 2

## 2021-09-18 MED ORDER — PANTOPRAZOLE SODIUM 40 MG IV SOLR
40.0000 mg | Freq: Once | INTRAVENOUS | Status: AC
  Administered 2021-09-18: 40 mg via INTRAVENOUS
  Filled 2021-09-18: qty 10

## 2021-09-18 MED ORDER — SUCRALFATE 1 G PO TABS: 1.0000 g | ORAL_TABLET | Freq: Three times a day (TID) | ORAL | 0 refills | Status: DC

## 2021-09-18 MED ORDER — ONDANSETRON HCL 4 MG/2ML IJ SOLN
4.0000 mg | Freq: Once | INTRAMUSCULAR | Status: AC
  Administered 2021-09-18: 4 mg via INTRAVENOUS
  Filled 2021-09-18: qty 2

## 2021-09-18 MED ORDER — ONDANSETRON 4 MG PO TBDP: 4.0000 mg | ORAL_TABLET | Freq: Three times a day (TID) | ORAL | 0 refills | Status: AC | PRN

## 2021-09-18 MED ORDER — ONDANSETRON 4 MG PO TBDP
4.0000 mg | ORAL_TABLET | Freq: Once | ORAL | Status: DC | PRN
  Administered 2021-09-18: 4 mg via ORAL
  Filled 2021-09-18: qty 1

## 2021-09-18 MED ORDER — IOHEXOL 300 MG/ML  SOLN
100.0000 mL | Freq: Once | INTRAMUSCULAR | Status: AC | PRN
  Administered 2021-09-18: 100 mL via INTRAVENOUS

## 2021-09-18 MED ORDER — OXYCODONE-ACETAMINOPHEN 5-325 MG PO TABS: 1.0000 | ORAL_TABLET | ORAL | 0 refills | Status: DC | PRN

## 2021-09-18 MED ORDER — METOCLOPRAMIDE HCL 10 MG PO TABS: 10.0000 mg | ORAL_TABLET | Freq: Three times a day (TID) | ORAL | 0 refills | Status: DC | PRN

## 2021-09-18 MED ORDER — KETOROLAC TROMETHAMINE 15 MG/ML IJ SOLN
15.0000 mg | Freq: Once | INTRAMUSCULAR | Status: AC
  Administered 2021-09-18: 15 mg via INTRAVENOUS
  Filled 2021-09-18: qty 1

## 2021-09-18 MED ORDER — SODIUM CHLORIDE 0.9 % IV BOLUS
1000.0000 mL | Freq: Once | INTRAVENOUS | Status: AC
  Administered 2021-09-18: 1000 mL via INTRAVENOUS

## 2021-09-18 MED ORDER — HYDROMORPHONE HCL 1 MG/ML IJ SOLN
0.5000 mg | Freq: Once | INTRAMUSCULAR | Status: AC
  Administered 2021-09-18: 0.5 mg via INTRAVENOUS
  Filled 2021-09-18: qty 0.5

## 2021-09-18 MED ORDER — PROMETHAZINE HCL 25 MG RE SUPP: 25.0000 mg | Freq: Four times a day (QID) | RECTAL | 0 refills | Status: DC | PRN

## 2021-09-18 MED ORDER — SUCRALFATE 1 G PO TABS
1.0000 g | ORAL_TABLET | Freq: Once | ORAL | Status: AC
  Administered 2021-09-18: 1 g via ORAL
  Filled 2021-09-18 (×2): qty 1

## 2021-09-18 MED ORDER — DICYCLOMINE HCL 10 MG PO CAPS
10.0000 mg | ORAL_CAPSULE | Freq: Once | ORAL | Status: AC
  Administered 2021-09-18: 10 mg via ORAL
  Filled 2021-09-18 (×2): qty 1

## 2021-09-18 NOTE — ED Notes (Signed)
Patient transported to CT 

## 2021-09-18 NOTE — ED Notes (Signed)
Patient states she is still having abdominal pain with no relief from medications given and nauseas. MD made aware

## 2021-09-18 NOTE — ED Provider Triage Note (Signed)
Emergency Medicine Provider Triage Evaluation Note  Mercedes Page , a 31 y.o. female  was evaluated in triage.  Pt complains of abdominal pain this AM and feels week and about to pass  Review of Systems  Positive: Stomach pain  Negative: No leg pain   Physical Exam  BP (!) 130/108 (BP Location: Right Arm)   Pulse 80   Temp 98.1 F (36.7 C) (Oral)   Resp 17   SpO2 99%  Gen:   Awake, no distress   Resp:  Normal effort  MSK:   Moves extremities without difficulty  Other:  Mid abdominal tenderness   Medical Decision Making  Medically screening exam initiated at 8:44 AM.  Appropriate orders placed.  Alleene Gielow was informed that the remainder of the evaluation will be completed by another provider, this initial triage assessment does not replace that evaluation, and the importance of remaining in the ED until their evaluation is complete.  Labs, IV    Concha Se, MD 09/18/21 [phone]

## 2021-09-18 NOTE — ED Notes (Signed)
This EDT and this a RN attempted. Lab called to ask for assistance, this RN was told "we have a line for the draws upstairs and we need to do them before we come there and she will probably be in a room before we get there".  This RN spoke to Sam in lab.

## 2021-09-18 NOTE — ED Provider Notes (Addendum)
[facility] Provider Note    Event Date/Time   First MD Initiated Contact with Patient 09/18/21 [phone]     (approximate)   History   Abdominal Pain   HPI  Mercedes Page is a 31 y.o. female who is otherwise healthy comes in with nausea vomiting diarrhea that started today.  Patient reports severe abdominal pain and dry heaving and multiple episodes of diarrhea.  She does state that she has not been taking some medicine for acid reflux and that she did eat some spicy food yesterday's that she is not sure if that is related.  She denies any falls, hitting her head chest pain shortness of breath or other concerns.     Physical Exam   Triage Vital Signs: ED Triage Vitals [09/18/21 0824]  Enc Vitals Group     BP (!) 130/108     Pulse Rate 80     Resp 17     Temp 98.1 F (36.7 C)     Temp Source Oral     SpO2 99 %     Weight      Height      Head Circumference      Peak Flow      Pain Score 10     Pain Loc      Pain Edu?      Excl. in GC?     Most recent vital signs: Vitals:   09/18/21 0824  BP: (!) 130/108  Pulse: 80  Resp: 17  Temp: 98.1 F (36.7 C)  SpO2: 99%     General: Awake, no distress.  CV:  Good peripheral perfusion.  Resp:  Normal effort.  Abd:  No distention.  Tender throughout the abdomen Other:     ED Results / Procedures / Treatments   Labs (all labs ordered are listed, but only abnormal results are displayed) Labs Reviewed  URINALYSIS, ROUTINE W REFLEX MICROSCOPIC - Abnormal; Notable for the following components:      Result Value   Color, Urine AMBER (*)    APPearance TURBID (*)    pH 9.0 (*)    Protein, ur 30 (*)    Bacteria, UA MANY (*)    All other components within normal limits  RESP PANEL BY RT-PCR (FLU A&B, COVID) ARPGX2  LIPASE, BLOOD  COMPREHENSIVE METABOLIC PANEL  CBC  HCG, QUANTITATIVE, PREGNANCY  POC URINE PREG, ED     EKG  My interpretation of EKG:  Normal sinus rate of 70 without any  ST elevation or T wave inversions, normal intervals  RADIOLOGY I have reviewed the CT personally and interpreted no evidence of kidney stones.  IMPRESSION: 1. Normal enhanced CT scan of the abdomen and pelvis.      PROCEDURES:  Critical Care performed: No  .1-3 Lead EKG Interpretation  Performed by: Concha Se, MD Authorized by: Concha Se, MD     Interpretation: normal     ECG rate:  80   ECG rate assessment: normal     Rhythm: sinus rhythm     Ectopy: none     Conduction: normal      MEDICATIONS ORDERED IN ED: Medications  sodium chloride 0.9 % bolus 1,000 mL (has no administration in time range)  ondansetron (ZOFRAN) injection 4 mg (has no administration in time range)  pantoprazole (PROTONIX) injection 40 mg (has no administration in time range)     IMPRESSION / MDM / ASSESSMENT AND PLAN / ED COURSE  I  reviewed the triage vital signs and the nursing notes.   Patient's presentation is most consistent with acute presentation with potential threat to life or bodily function.   Differential includes gastroenteritis, COVID, abdominal obstruction, perforation.  Patient is crying rocking back and forth on the toilet and pain.  We will proceed with CT imaging to rule out other acute pathology as well as get stool studies to evaluate for GI pathogens, C. difficile.  Patient requesting IV pain medication.  We will give 1 dose of IV pain medicine while awaiting work-up.  UA without evidence of UTI but does have many bacteria will send for urine culture.  Pregnancy test was negative lipase normal CMP normal COVID-negative hCG negative.  Her CBC hemolyzed will need to send a new test  Patient's been unable to give stool sample.  Her CT imaging is reassuring.  She mostly reports upper abdominal discomfort.  Gallbladder looked normal on CT imaging and she has had ultrasound previously that looks reassuring without gallstones and she reports having this pain previously related  to acid/suspect that it is more likely related to that.  She reports a history of acid reflux.  She does not have any relief with the droperidol and is requesting Phenergan.  We will give 1 dose, Toradol, Carafate, Bentyl to help with symptoms.  She has no lower abdominal pain denies any vaginal discharge or concerns for STDs so we will hold off on pelvic.  She denies any alcohol or drug abuse.  Reevaluated patient she reports still feeling nauseous but had no active vomiting no diarrhea since being here.  We will do p.o. challenge.  We will start her on antibiotic for possible UTI given bacteria in the urine but seems less likely.  Her pain seems to be more epigastric in nature probably related to some acid reflux. No RUQ pain to suggest gallstones   Anticipate discharge home after p.o. challenge.  She has no episodes of diarrhea here to send for stool sampling and no episodes of vomiting here just feels nauseous.  We discussed that this could take a few days for her to get over it started on some medicine to help with her acid reflux and nausea at home and she understands that she can return if symptoms are worsening.  No lower abdominal pain, no vaginal discharge, denies concern for retained foreign body or pelvic pain. She reports pain only in the upper abdomin. We discussed ulcer possibility.  She will f/u with GI if not getting better but again given diarrhea seems more likely gastroenteritis.   The patient is on the cardiac monitor to evaluate for evidence of arrhythmia and/or significant heart rate changes.      FINAL CLINICAL IMPRESSION(S) / ED DIAGNOSES   Final diagnoses:  Gastroenteritis  Acute cystitis without hematuria     Rx / DC Orders   ED Discharge Orders          Ordered    pantoprazole (PROTONIX) 40 MG tablet  Daily        09/18/21 1506    sucralfate (CARAFATE) 1 g tablet  3 times daily with meals & bedtime        09/18/21 1506    ondansetron (ZOFRAN-ODT) 4 MG  disintegrating tablet  Every 8 hours PRN        09/18/21 1506    promethazine (PHENERGAN) 25 MG suppository  Every 6 hours PRN        09/18/21 1506    cefdinir (OMNICEF) 300  MG capsule  2 times daily        09/18/21 1507             Note:  This document was prepared using Dragon voice recognition software and may include unintentional dictation errors.   Concha Se, MD 09/18/21 1508    Concha Se, MD 09/18/21 1523    Concha Se, MD 09/18/21 1540

## 2021-09-18 NOTE — ED Notes (Signed)
Pt called out, reports medication is not helping pain and nausea. Pt will not keep on monitoring equipment. Made aware we need a stool sample and to call out when needing to void next.

## 2021-09-18 NOTE — ED Notes (Signed)
Pts husband came to the desk, advised "the iv has pulled out". Pts meds had finished. And competed on chart

## 2021-09-18 NOTE — ED Triage Notes (Signed)
Pt present to ED via EMS with c/o of ABD, N/V/D that started this morning at 0500. Pt states she has GERD. Pt states she is weak and "going to pass out", pt ambulated to triage with steady gait.

## 2021-09-18 NOTE — ED Triage Notes (Signed)
Pt in via EMS from home with c/o abd pain above belly button since 0500 this am with NVD. Pt ate spicy food last pm and did not take her acid meds. HR 60, 100% RA, 146/94, 98.9 temp, CBG 133

## 2021-09-18 NOTE — Discharge Instructions (Addendum)
Stay well hydrated with pediatyte.  Take the antibiotics for possible UTI.  Return to the ER for worsening symptoms or any other concerns

## 2021-09-20 LAB — URINE CULTURE

## 2022-02-01 ENCOUNTER — Telehealth: Payer: Self-pay

## 2022-02-01 NOTE — Telephone Encounter (Signed)
I received a message from Noel to cancel the appt. ep

## 2022-11-01 ENCOUNTER — Emergency Department
Admission: EM | Admit: 2022-11-01 | Discharge: 2022-11-01 | Disposition: A | Discharge status: Home / Self Care | Payer: BC Managed Care – PPO | Attending: Emergency Medicine | Admitting: Emergency Medicine

## 2022-11-01 ENCOUNTER — Other Ambulatory Visit: Payer: Self-pay

## 2022-11-01 DIAGNOSIS — L729 Follicular cyst of the skin and subcutaneous tissue, unspecified: Secondary | ICD-10-CM | POA: Insufficient documentation

## 2022-11-01 DIAGNOSIS — L739 Follicular disorder, unspecified: Secondary | ICD-10-CM

## 2022-11-01 LAB — WET PREP, GENITAL
Clue Cells Wet Prep HPF POC: NONE SEEN
Sperm: NONE SEEN
Trich, Wet Prep: NONE SEEN
WBC, Wet Prep HPF POC: <10 (ref <10)
Yeast Wet Prep HPF POC: NONE SEEN

## 2022-11-01 LAB — URINALYSIS, ROUTINE W REFLEX MICROSCOPIC
Bilirubin Urine: NEGATIVE
Glucose, UA: NEGATIVE mg/dL
Ketones, ur: 20 mg/dL — AB
Leukocytes,Ua: NEGATIVE
Nitrite: NEGATIVE
Protein, ur: NEGATIVE mg/dL
Specific Gravity, Urine: 1.02 (ref 1.005–1.030)
pH: 6 (ref 5.0–8.0)

## 2022-11-01 LAB — CHLAMYDIA/NGC RT PCR (ARMC ONLY)
Chlamydia Tr: NOT DETECTED
N gonorrhoeae: NOT DETECTED

## 2022-11-01 MED ORDER — CEPHALEXIN 500 MG PO CAPS
500.0000 mg | ORAL_CAPSULE | Freq: Four times a day (QID) | ORAL | 0 refills | Status: AC
Start: 2022-11-01 — End: 2022-11-08

## 2022-11-01 MED ORDER — DOXYCYCLINE MONOHYDRATE 100 MG PO TABS: 100.0000 mg | ORAL_TABLET | Freq: Two times a day (BID) | ORAL | 0 refills | Status: AC

## 2022-11-01 NOTE — ED Triage Notes (Signed)
Pt to ED for abscess to left groin for a few days. States it "busted" today.

## 2022-11-01 NOTE — ED Notes (Signed)
See triage note  Presents with some dysuria for couple of days  Then also noticed a possible abscess area to left groin   States she had put some cream on it and thinks it made it worse  This  am she noticed some drainage

## 2022-11-01 NOTE — ED Provider Notes (Signed)
[facility] Provider Note    Event Date/Time   First MD Initiated Contact with Patient 11/01/22 [phone]     (approximate)   History   Abscess   HPI  Mercedes Page is a 32 y.o. female with a past medical history of hidradenitis suppurativa who presents today for evaluation of abscess to her left groin area.  Patient reports that she shaves this area, and has had what she believed to be an ingrown hair that got larger and then it ruptured today.  She reports that she still has mild discomfort at the site.  Secondly, she reports that she had BV several months ago and she would like to be tested for this again.  She reports that she is sexually active with her husband only.  She denies any vaginal discharge different than her baseline.  She has noticed an odor in this area but she is unsure if this is coming from a possible BV infection, or the ingrown hair that have popped.  No fevers or chills.  No dyspareunia.  No dysuria.  There are no problems to display for this patient.         Physical Exam   Triage Vital Signs: ED Triage Vitals  Encounter Vitals Group     BP 11/01/22 0836 (!) 135/90     Systolic BP Percentile --      Diastolic BP Percentile --      Pulse Rate 11/01/22 0836 80     Resp 11/01/22 0836 18     Temp 11/01/22 0836 98.2 F (36.8 C)     Temp src --      SpO2 11/01/22 0836 100 %     Weight 11/01/22 0835 220 lb (99.8 kg)     Height 11/01/22 0835 5\' 7"  (1.702 m)     Head Circumference --      Peak Flow --      Pain Score 11/01/22 0835 7     Pain Loc --      Pain Education --      Exclude from Growth Chart --     Most recent vital signs: Vitals:   11/01/22 0836  BP: (!) 135/90  Pulse: 80  Resp: 18  Temp: 98.2 F (36.8 C)  SpO2: 100%    Physical Exam Vitals and nursing note reviewed.  Constitutional:      General: Awake and alert. No acute distress.    Appearance: Normal appearance. The patient is obese.  HENT:     Head:  Normocephalic and atraumatic.     Mouth: Mucous membranes are moist.  Eyes:     General: PERRL. Normal EOMs        Right eye: No discharge.        Left eye: No discharge.     Conjunctiva/sclera: Conjunctivae normal.  Cardiovascular:     Rate and Rhythm: Normal rate and regular rhythm.     Pulses: Normal pulses.  Pulmonary:     Effort: Pulmonary effort is normal. No respiratory distress.     Breath sounds: Normal breath sounds.  Abdominal:     Abdomen is soft. There is no abdominal tenderness. No rebound or guarding. No distention.  Left pubic hair area with 4mm opening with adjacent induration, no fluctuance or crepitus. No obvious swelling or erythema. No further drainage. No labial involvement Musculoskeletal:        General: No swelling. Normal range of motion.     Cervical back: Normal range  of motion and neck supple.  Skin:    General: Skin is warm and dry.     Capillary Refill: Capillary refill takes less than 2 seconds.     Findings: No rash.  Neurological:     Mental Status: The patient is awake and alert.      ED Results / Procedures / Treatments   Labs (all labs ordered are listed, but only abnormal results are displayed) Labs Reviewed  URINALYSIS, ROUTINE W REFLEX MICROSCOPIC - Abnormal; Notable for the following components:      Result Value   Color, Urine YELLOW (*)    APPearance CLEAR (*)    Hgb urine dipstick MODERATE (*)    Ketones, ur 20 (*)    Bacteria, UA RARE (*)    All other components within normal limits  WET PREP, GENITAL  CHLAMYDIA/NGC RT PCR ([facility] ONLY)               EKG     RADIOLOGY     PROCEDURES:  Critical Care performed:   Procedures   MEDICATIONS ORDERED IN ED: Medications - No data to display   IMPRESSION / MDM / ASSESSMENT AND PLAN / ED COURSE  I reviewed the triage vital signs and the nursing notes.   Differential diagnosis includes, but is not limited to, folliculitis, furuncle, carbuncle, abscess, bacterial  vaginosis, STD.  Patient is awake and alert, hemodynamically stable and afebrile.  She is nontoxic in appearance.  Further workup is indicated.  She declined full pelvic exam, denies any pelvic pain or dyspareunia to suggest PID.  Swabs obtained are negative.  The area of swelling to her groin area is within her pubic hair and appears that she has an ingrown hair.  There is no fluctuance to suggest abscess.  There is some indurated tissue around the draining site, though there is no further drainage noted.  Wet prep and GC/chlamydia are negative.  Will treat for cellulitis/folliculitis.  She was started on Keflex and Doxy.  We discussed return precautions and outpatient follow-up.  Patient understands and agrees with plan.  She was discharged in stable condition.   Patient's presentation is most consistent with acute complicated illness / injury requiring diagnostic workup.      FINAL CLINICAL IMPRESSION(S) / ED DIAGNOSES   Final diagnoses:  Folliculitis     Rx / DC Orders   ED Discharge Orders          Ordered    doxycycline (ADOXA) 100 MG tablet  2 times daily        11/01/22 1121    cephALEXin (KEFLEX) 500 MG capsule  4 times daily        11/01/22 1121             Note:  This document was prepared using Dragon voice recognition software and may include unintentional dictation errors.   Jackelyn Hoehn, PA-C 11/01/22 1311    Corena Herter, MD 11/01/22 1525

## 2022-11-01 NOTE — Discharge Instructions (Signed)
Please take the antibiotics as prescribed.  Please return for any new, worsening, or change in symptoms or other concerns.  It was a pleasure caring for you today.

## 2023-02-09 ENCOUNTER — Other Ambulatory Visit: Payer: Self-pay

## 2023-02-09 ENCOUNTER — Emergency Department
Admission: EM | Admit: 2023-02-09 | Discharge: 2023-02-09 | Disposition: A | Discharge status: Home / Self Care | Payer: BC Managed Care – PPO | Attending: Emergency Medicine | Admitting: Emergency Medicine

## 2023-02-09 DIAGNOSIS — R1084 Generalized abdominal pain: Secondary | ICD-10-CM | POA: Diagnosis not present

## 2023-02-09 DIAGNOSIS — R197 Diarrhea, unspecified: Secondary | ICD-10-CM | POA: Diagnosis not present

## 2023-02-09 DIAGNOSIS — R112 Nausea with vomiting, unspecified: Secondary | ICD-10-CM | POA: Diagnosis present

## 2023-02-09 DIAGNOSIS — D72829 Elevated white blood cell count, unspecified: Secondary | ICD-10-CM | POA: Insufficient documentation

## 2023-02-09 LAB — COMPREHENSIVE METABOLIC PANEL
ALT: 28 U/L (ref 0–44)
AST: 21 U/L (ref 15–41)
Albumin: 4.1 g/dL (ref 3.5–5.0)
Alkaline Phosphatase: 50 U/L (ref 38–126)
Anion gap: 10 (ref 5–15)
BUN: 15 mg/dL (ref 6–20)
CO2: 24 mmol/L (ref 22–32)
Calcium: 9 mg/dL (ref 8.9–10.3)
Chloride: 104 mmol/L (ref 98–111)
Creatinine, Ser: 0.67 mg/dL (ref 0.44–1.00)
GFR, Estimated: >60 mL/min (ref >60)
Glucose, Bld: 140 mg/dL — ABNORMAL HIGH (ref 70–99)
Potassium: 4 mmol/L (ref 3.5–5.1)
Sodium: 138 mmol/L (ref 135–145)
Total Bilirubin: 1 mg/dL (ref 0.0–1.2)
Total Protein: 7.7 g/dL (ref 6.5–8.1)

## 2023-02-09 LAB — POC URINE PREG, ED: Preg Test, Ur: NEGATIVE

## 2023-02-09 LAB — CBC
HCT: 40.6 % (ref 36.0–46.0)
Hemoglobin: 13.5 g/dL (ref 12.0–15.0)
MCH: 29.7 pg (ref 26.0–34.0)
MCHC: 33.3 g/dL (ref 30.0–36.0)
MCV: 89.4 fL (ref 80.0–100.0)
Platelets: 406 10*3/uL — ABNORMAL HIGH (ref 150–400)
RBC: 4.54 MIL/uL (ref 3.87–5.11)
RDW: 12.7 % (ref 11.5–15.5)
WBC: 18.8 10*3/uL — ABNORMAL HIGH (ref 4.0–10.5)
nRBC: 0 % (ref 0.0–0.2)

## 2023-02-09 LAB — LIPASE, BLOOD: Lipase: 35 U/L (ref 11–51)

## 2023-02-09 MED ORDER — PANTOPRAZOLE SODIUM 40 MG PO TBEC: 40.0000 mg | DELAYED_RELEASE_TABLET | Freq: Every day | ORAL | 1 refills | Status: AC

## 2023-02-09 MED ORDER — DICYCLOMINE HCL 10 MG PO CAPS
20.0000 mg | ORAL_CAPSULE | Freq: Once | ORAL | Status: AC
  Administered 2023-02-09: 20 mg via ORAL
  Filled 2023-02-09: qty 2

## 2023-02-09 MED ORDER — SODIUM CHLORIDE 0.9 % IV BOLUS (SEPSIS)
1000.0000 mL | Freq: Once | INTRAVENOUS | Status: AC
  Administered 2023-02-09: 1000 mL via INTRAVENOUS

## 2023-02-09 MED ORDER — DICYCLOMINE HCL 20 MG PO TABS: 20.0000 mg | ORAL_TABLET | Freq: Three times a day (TID) | ORAL | 0 refills | Status: AC | PRN

## 2023-02-09 MED ORDER — PANTOPRAZOLE SODIUM 40 MG IV SOLR
40.0000 mg | Freq: Once | INTRAVENOUS | Status: AC
  Administered 2023-02-09: 40 mg via INTRAVENOUS
  Filled 2023-02-09: qty 10

## 2023-02-09 MED ORDER — ONDANSETRON 4 MG PO TBDP: 4.0000 mg | ORAL_TABLET | Freq: Four times a day (QID) | ORAL | 0 refills | Status: AC | PRN

## 2023-02-09 MED ORDER — ONDANSETRON HCL 4 MG/2ML IJ SOLN
4.0000 mg | Freq: Once | INTRAMUSCULAR | Status: AC
  Administered 2023-02-09: 4 mg via INTRAVENOUS
  Filled 2023-02-09: qty 2

## 2023-02-09 NOTE — Discharge Instructions (Addendum)
You may alternate Tylenol 1000 mg every 6 hours as needed for pain, fever and Ibuprofen 800 mg every 6-8 hours as needed for pain, fever.  Please take Ibuprofen with food.  Do not take more than 4000 mg of Tylenol (acetaminophen) in a 24 hour period.  You may take over-the-counter Imodium as needed for diarrhea.  I recommend a bland diet for the next several days.

## 2023-02-09 NOTE — ED Notes (Signed)
PO challenge successful. Pt states that she was able to eat and drink, denies n/v at this time.

## 2023-02-09 NOTE — ED Provider Notes (Signed)
[facility] Provider Note    Event Date/Time   First MD Initiated Contact with Patient 02/09/23 0402     (approximate)   History   Emesis   HPI  Mercedes Page is a 33 y.o. female with history of GERD on famotidine who presents to the emergency department with nausea, vomiting, diarrhea and generalized abdominal pain today.  States symptoms started at 10 PM.  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  No dysuria, hematuria, vaginal bleeding or discharge.  No previous abdominal surgery.   History provided by patient, family.    History reviewed. No pertinent past medical history.  Past Surgical History:  Procedure Laterality Date   addenoidectomy     TONSILLECTOMY      MEDICATIONS:  Prior to Admission medications   Medication Sig Start Date End Date Taking? Authorizing Provider  acetaminophen (TYLENOL) 500 MG tablet Take 500-1,000 mg by mouth every 6 (six) hours as needed for mild pain or moderate pain.    [provider]  dicyclomine (BENTYL) 20 MG tablet Take 1 tablet (20 mg total) by mouth 3 (three) times daily as needed for spasms. 09/15/18 09/15/19  Minna Antis, MD  pantoprazole (PROTONIX) 40 MG tablet Take 1 tablet (40 mg total) by mouth daily for 14 days. 09/18/21 10/02/21  Concha Se, MD  promethazine (PHENERGAN) 25 MG suppository Place 1 suppository (25 mg total) rectally every 6 (six) hours as needed for nausea or vomiting. 09/18/21   Concha Se, MD  sucralfate (CARAFATE) 1 g tablet Take 1 tablet (1 g total) by mouth 4 (four) times daily -  with meals and at bedtime for 14 days. 09/18/21 10/02/21  Concha Se, MD    Physical Exam   Triage Vital Signs: ED Triage Vitals  Encounter Vitals Group     BP 02/09/23 0415 (!) 147/87     Systolic BP Percentile --      Diastolic BP Percentile --      Pulse Rate 02/09/23 0415 (!) 106     Resp 02/09/23 0415 18     Temp 02/09/23 0410 97.6 F (36.4 C)     Temp Source 02/09/23 0410 Oral      SpO2 02/09/23 0410 100 %     Weight 02/09/23 0358 240 lb (108.9 kg)     Height 02/09/23 0358 5\' 8"  (1.727 m)     Head Circumference --      Peak Flow --      Pain Score 02/09/23 0358 10     Pain Loc --      Pain Education --      Exclude from Growth Chart --      Most recent vital signs: Vitals:   02/09/23 0410 02/09/23 0415  BP:  (!) 147/87  Pulse:  (!) 106  Resp:  18  Temp: 97.6 F (36.4 C)   SpO2: 100% 100%    CONSTITUTIONAL: Alert, responds appropriately to questions. Well-appearing; well-nourished HEAD: Normocephalic, atraumatic EYES: Conjunctivae clear, pupils appear equal, sclera nonicteric ENT: normal nose; moist mucous membranes NECK: Supple, normal ROM CARD: Regular and tachycardic; S1 and S2 appreciated RESP: Normal chest excursion without splinting or tachypnea; breath sounds clear and equal bilaterally; no wheezes, no rhonchi, no rales, no hypoxia or respiratory distress, speaking full sentences ABD/GI: Non-distended; soft, non-tender, no rebound, no guarding, no peritoneal signs, no tenderness at McBurney's point BACK: The back appears normal EXT: Normal ROM in all joints; no deformity noted, no edema  SKIN: Normal color for age and race; warm; no rash on exposed skin NEURO: Moves all extremities equally, normal speech PSYCH: The patient's mood and manner are appropriate.   ED Results / Procedures / Treatments   LABS: (all labs ordered are listed, but only abnormal results are displayed) Labs Reviewed  COMPREHENSIVE METABOLIC PANEL - Abnormal; Notable for the following components:      Result Value   Glucose, Bld 140 (*)    All other components within normal limits  CBC - Abnormal; Notable for the following components:   WBC 18.8 (*)    Platelets 406 (*)    All other components within normal limits  LIPASE, BLOOD  URINALYSIS, ROUTINE W REFLEX MICROSCOPIC  POC URINE PREG, ED     EKG:   RADIOLOGY: My personal review and interpretation of  imaging:    I have personally reviewed all radiology reports.   No results found.   PROCEDURES:  Critical Care performed: No      Procedures    IMPRESSION / MDM / ASSESSMENT AND PLAN / ED COURSE  I reviewed the triage vital signs and the nursing notes.    Patient here with nausea, vomiting and diarrhea.   DIFFERENTIAL DIAGNOSIS (includes but not limited to):   Viral gastroenteritis, dehydration, less likely appendicitis, perforation, bowel obstruction, colitis, diverticulitis, cholecystitis, pancreatitis given benign exam   Patient's presentation is most consistent with acute presentation with potential threat to life or bodily function.   PLAN: Will obtain labs, urine.  Will give IV fluids, Bentyl, Protonix and Zofran for symptomatic relief.  Abdominal exam benign.  Low suspicion for surgical pathology.   MEDICATIONS GIVEN IN ED: Medications  sodium chloride 0.9 % bolus 1,000 mL (1,000 mLs Intravenous New Bag/Given 02/09/23 0427)  ondansetron (ZOFRAN) injection 4 mg (4 mg Intravenous Given 02/09/23 0427)  pantoprazole (PROTONIX) injection 40 mg (40 mg Intravenous Given 02/09/23 0429)  dicyclomine (BENTYL) capsule 20 mg (20 mg Oral Given 02/09/23 0428)     ED COURSE: Patient's labs show a leukocytosis of 18,000.  Some of this appears chronic for patient.  Normal electrolytes, LFTs and lipase.  Patient feeling better, tolerating p.o.  Pregnancy test negative.  I feel she is safe for discharge.  Discussed supportive care instructions and return precautions.  Recommended bland diet.  At this time, I do not feel there is any life-threatening condition present. I reviewed all nursing notes, vitals, pertinent previous records.  All lab and urine results, EKGs, imaging ordered have been independently reviewed and interpreted by myself.  I reviewed all available radiology reports from any imaging ordered this visit.  Based on my assessment, I feel the patient is safe to be  discharged home without further emergent workup and can continue workup as an outpatient as needed. Discussed all findings, treatment plan as well as usual and customary return precautions.  They verbalize understanding and are comfortable with this plan.  Outpatient follow-up has been provided as needed.  All questions have been answered.    CONSULTS:  none   OUTSIDE RECORDS REVIEWED: Reviewed last OB/GYN note in November 2023.       FINAL CLINICAL IMPRESSION(S) / ED DIAGNOSES   Final diagnoses:  Nausea vomiting and diarrhea     Rx / DC Orders   ED Discharge Orders          Ordered    ondansetron (ZOFRAN-ODT) 4 MG disintegrating tablet  Every 6 hours PRN        02/09/23 0517  dicyclomine (BENTYL) 20 MG tablet  Every 8 hours PRN        02/09/23 0517    pantoprazole (PROTONIX) 40 MG tablet  Daily        02/09/23 0517             Note:  This document was prepared using Dragon voice recognition software and may include unintentional dictation errors.   Siddarth Hsiung, Layla Maw, DO 02/09/23 [phone]

## 2023-02-09 NOTE — ED Triage Notes (Signed)
Pt presents via POV c/o N/V/D since last PM at apx 2200. Pt also reports abd pain with the vomiting.   Reports on antiacid however has not taken and consumed spicy food yesterday.   Ambulatory to triage. A&O x4.

## 2023-10-22 ENCOUNTER — Emergency Department: Admission: EM | Admit: 2023-10-22 | Discharge: 2023-10-22 | Disposition: A | Discharge status: Home / Self Care

## 2023-10-22 ENCOUNTER — Other Ambulatory Visit: Payer: Self-pay

## 2023-10-22 ENCOUNTER — Emergency Department

## 2023-10-22 ENCOUNTER — Encounter: Payer: Self-pay | Admitting: Emergency Medicine

## 2023-10-22 DIAGNOSIS — R102 Pelvic and perineal pain unspecified side: Secondary | ICD-10-CM | POA: Diagnosis not present

## 2023-10-22 DIAGNOSIS — R1011 Right upper quadrant pain: Secondary | ICD-10-CM | POA: Diagnosis present

## 2023-10-22 LAB — CBC
HCT: 36.8 % (ref 36.0–46.0)
Hemoglobin: 12.2 g/dL (ref 12.0–15.0)
MCH: 30.1 pg (ref 26.0–34.0)
MCHC: 33.2 g/dL (ref 30.0–36.0)
MCV: 90.9 fL (ref 80.0–100.0)
Platelets: 476 K/uL — ABNORMAL HIGH (ref 150–400)
RBC: 4.05 MIL/uL (ref 3.87–5.11)
RDW: 12.5 % (ref 11.5–15.5)
WBC: 14.6 K/uL — ABNORMAL HIGH (ref 4.0–10.5)
nRBC: 0 % (ref 0.0–0.2)

## 2023-10-22 LAB — URINALYSIS, ROUTINE W REFLEX MICROSCOPIC
Bacteria, UA: NONE SEEN
RBC / HPF: >50 RBC/hpf (ref 0–5)

## 2023-10-22 LAB — BASIC METABOLIC PANEL WITH GFR
Anion gap: 11 (ref 5–15)
BUN: 12 mg/dL (ref 6–20)
CO2: 24 mmol/L (ref 22–32)
Calcium: 9.1 mg/dL (ref 8.9–10.3)
Chloride: 104 mmol/L (ref 98–111)
Creatinine, Ser: 0.92 mg/dL (ref 0.44–1.00)
GFR, Estimated: >60 mL/min (ref >60)
Glucose, Bld: 110 mg/dL — ABNORMAL HIGH (ref 70–99)
Potassium: 4 mmol/L (ref 3.5–5.1)
Sodium: 139 mmol/L (ref 135–145)

## 2023-10-22 MED ORDER — KETOROLAC TROMETHAMINE 30 MG/ML IJ SOLN
30.0000 mg | Freq: Once | INTRAMUSCULAR | Status: AC
  Administered 2023-10-22: 30 mg via INTRAMUSCULAR
  Filled 2023-10-22: qty 1

## 2023-10-22 MED ORDER — NAPROXEN 500 MG PO TBEC: 500.0000 mg | DELAYED_RELEASE_TABLET | Freq: Two times a day (BID) | ORAL | 0 refills | Status: AC

## 2023-10-22 NOTE — Discharge Instructions (Addendum)
 You have been diagnosed with pelvic pain in female.  Ultrasound rule it out fibroids, ovarian cyst.  Test was negative.  You are not anemic.  Please take naproxen  1 tablet every 12 hours after main meals for pain.  Please call your OB/GYN and make an appointment for a follow-up this week.  Come back to ED or go to your PCP if you have new symptoms or symptoms worsen.

## 2023-10-22 NOTE — ED Triage Notes (Signed)
 Pt arrives POV, C/O lower abdominal pain/cramping with menses.  States pain is worse than her normal cramping.  Has been taking ibuprofen with little effectiveness.

## 2023-10-22 NOTE — ED Provider Notes (Signed)
 [facility] Provider Note    Event Date/Time   First MD Initiated Contact with Patient 10/22/23 2057     (approximate)   History   Abdominal Pain (W/menses )    HPI  Mercedes Page is a 33 y.o. female    with a past medical history of gastroenteritis, nauseous vomit, right upper quadrant pain,who presents to the ED complaining of lower abdominal pain. According to the patient, and she is having metrorrhagia and menorrhagia after stopping Depo.  She was seen by PCP who ordered medication to stop the bleeding.  Patient states having lower cramping abdominal pain associated to menstruation.  Patient denies vaginal discharge, dysuria, urinary symptoms, diarrhea.      There are no active problems to display for this patient.   ROS: Patient currently denies any vision changes, tinnitus, difficulty speaking, facial droop, sore throat, chest pain, shortness of breath, abdominal pain, nausea/vomiting/diarrhea, dysuria, or weakness/numbness/paresthesias in any extremity   Physical Exam   Triage Vital Signs: ED Triage Vitals  Encounter Vitals Group     BP 10/22/23 2028 123/84     Girls Systolic BP Percentile --      Girls Diastolic BP Percentile --      Boys Systolic BP Percentile --      Boys Diastolic BP Percentile --      Pulse Rate 10/22/23 2028 75     Resp 10/22/23 2028 18     Temp 10/22/23 2028 97.6 F (36.4 C)     Temp Source 10/22/23 2028 Oral     SpO2 10/22/23 2028 100 %     Weight 10/22/23 2033 245 lb (111.1 kg)     Height 10/22/23 2033 5' 8 (1.727 m)     Head Circumference --      Peak Flow --      Pain Score 10/22/23 2032 7     Pain Loc --      Pain Education --      Exclude from Growth Chart --     Most recent vital signs: Vitals:   10/22/23 2028 10/22/23 2053  BP: 123/84   Pulse: 75   Resp: 18   Temp: 97.6 F (36.4 C)   SpO2: 100% 100%     Physical Exam Vitals and nursing note reviewed.  During triage vital signs were  normal  General:          Awake, no distress.  CV:                  Good peripheral perfusion.  Resp:               Normal effort. no tachypnea Abd:                 No distention.  Soft nontender to palpation. Other:               ED Results / Procedures / Treatments   Labs (all labs ordered are listed, but only abnormal results are displayed) Labs Reviewed  BASIC METABOLIC PANEL WITH GFR - Abnormal; Notable for the following components:      Result Value   Glucose, Bld 110 (*)    All other components within normal limits  CBC - Abnormal; Notable for the following components:   WBC 14.6 (*)    Platelets 476 (*)    All other components within normal limits  URINALYSIS, ROUTINE W REFLEX MICROSCOPIC - Abnormal; Notable for the following components:  Color, Urine RED (*)    APPearance BLOODY (*)    Glucose, UA   (*)    Value: TEST NOT REPORTED DUE TO COLOR INTERFERENCE OF URINE PIGMENT   Hgb urine dipstick   (*)    Value: TEST NOT REPORTED DUE TO COLOR INTERFERENCE OF URINE PIGMENT   Bilirubin Urine   (*)    Value: TEST NOT REPORTED DUE TO COLOR INTERFERENCE OF URINE PIGMENT   Ketones, ur   (*)    Value: TEST NOT REPORTED DUE TO COLOR INTERFERENCE OF URINE PIGMENT   Protein, ur   (*)    Value: TEST NOT REPORTED DUE TO COLOR INTERFERENCE OF URINE PIGMENT   Nitrite   (*)    Value: TEST NOT REPORTED DUE TO COLOR INTERFERENCE OF URINE PIGMENT   Leukocytes,Ua   (*)    Value: TEST NOT REPORTED DUE TO COLOR INTERFERENCE OF URINE PIGMENT   All other components within normal limits       RADIOLOGY I independently reviewed and interpreted imaging and agree with radiologists findings.      PROCEDURES:  Critical Care performed:   Procedures   MEDICATIONS ORDERED IN ED: Medications  ketorolac  (TORADOL ) 30 MG/ML injection 30 mg (has no administration in time range)   Clinical Course as of 10/22/23 2259  Sun Oct 22, 2023  2111 Basic metabolic panel(!) Electrolyte, renal  function within normal limits [AE]  2111 CBC(!) White blood cells elevated 14.6, platelets elevated 476.  Hemoglobin within normal limits, patient is not anemic [AE]  2250 US  Pelvis Complete Normal transabdominal evaluation of the pelvis. [AE]  2254 Reassessed the patient.  Patient continues with lower abdominal pain.  Updated patient with results of the ultrasound negative for fibroids, cyst.  UTI negative for infection.  Patient is going to be discharged with naproxen  and follow-up with OB/GYN.  Patient is agreeable with the plan [AE]    Clinical Course User Index [AE] Janit Kast, PA-C    IMPRESSION / MDM / ASSESSMENT AND PLAN / ED COURSE  I reviewed the triage vital signs and the nursing notes.  Differential diagnosis includes, but is not limited to, fibroids, metrorrhagia, pregnancy  Patient's presentation is most consistent with acute complicated illness / injury requiring diagnostic workup.   Mercedes Page is a 33 y.o., female presents today with history of lower abdominal pain described as a cramps while related with her menstruation.  On a physical exam there is no signs of peritoneal irritation.  Plan Pregnancy test Ultrasound Toradol  IM Reassess Pregnancy test was negative, ultrasound was negative for fibroids, pregnancy, ovarian torsion, cystic ovarian cyst. Patient's diagnosis is consistent with metrorrhagia, lower abdominal pain. I independently reviewed and interpreted imaging and agree with radiologists findings. Labs are  reassuring. I did review the patient's allergies and medications.The patient is in stable and satisfactory condition for discharge home  Patient will be discharged home with prescriptions for naproxen . Patient is to follow up with OB/GYN as needed or otherwise directed. Patient is given ED precautions to return to the ED for any worsening or new symptoms.  Work note was provided Discussed plan of care with patient, answered all of patient's  questions, Patient agreeable to plan of care. Advised patient to take medications according to the instructions on the label. Discussed possible side effects of new medications. Patient verbalized understanding.  FINAL CLINICAL IMPRESSION(S) / ED DIAGNOSES   Final diagnoses:  Pelvic pain in female     Rx / DC Orders  ED Discharge Orders          Ordered    naproxen  (EC NAPROSYN ) 500 MG EC tablet  2 times daily with meals        10/22/23 2259             Note:  This document was prepared using Dragon voice recognition software and may include unintentional dictation errors.   Janit Kast, PA-C 10/22/23 2300    Clarine Ozell LABOR, MD 10/23/23 2348

## 2023-10-23 ENCOUNTER — Emergency Department

## 2023-10-23 ENCOUNTER — Emergency Department
Admission: EM | Admit: 2023-10-23 | Discharge: 2023-10-24 | Disposition: A | Attending: Emergency Medicine | Admitting: Emergency Medicine

## 2023-10-23 DIAGNOSIS — R1084 Generalized abdominal pain: Secondary | ICD-10-CM | POA: Insufficient documentation

## 2023-10-23 DIAGNOSIS — R112 Nausea with vomiting, unspecified: Secondary | ICD-10-CM | POA: Insufficient documentation

## 2023-10-23 DIAGNOSIS — D72829 Elevated white blood cell count, unspecified: Secondary | ICD-10-CM | POA: Insufficient documentation

## 2023-10-23 LAB — URINALYSIS, ROUTINE W REFLEX MICROSCOPIC
Bacteria, UA: NONE SEEN
RBC / HPF: >50 RBC/hpf (ref 0–5)
WBC, UA: >50 WBC/hpf (ref 0–5)

## 2023-10-23 LAB — COMPREHENSIVE METABOLIC PANEL WITH GFR
ALT: 29 U/L (ref 0–44)
AST: 23 U/L (ref 15–41)
Albumin: 4.1 g/dL (ref 3.5–5.0)
Alkaline Phosphatase: 40 U/L (ref 38–126)
Anion gap: 10 (ref 5–15)
BUN: 13 mg/dL (ref 6–20)
CO2: 24 mmol/L (ref 22–32)
Calcium: 9.1 mg/dL (ref 8.9–10.3)
Chloride: 103 mmol/L (ref 98–111)
Creatinine, Ser: 0.8 mg/dL (ref 0.44–1.00)
GFR, Estimated: >60 mL/min (ref >60)
Glucose, Bld: 107 mg/dL — ABNORMAL HIGH (ref 70–99)
Potassium: 3.8 mmol/L (ref 3.5–5.1)
Sodium: 137 mmol/L (ref 135–145)
Total Bilirubin: 0.6 mg/dL (ref 0.0–1.2)
Total Protein: 8.1 g/dL (ref 6.5–8.1)

## 2023-10-23 LAB — CBC
HCT: 35.1 % — ABNORMAL LOW (ref 36.0–46.0)
Hemoglobin: 11.5 g/dL — ABNORMAL LOW (ref 12.0–15.0)
MCH: 29.6 pg (ref 26.0–34.0)
MCHC: 32.8 g/dL (ref 30.0–36.0)
MCV: 90.5 fL (ref 80.0–100.0)
Platelets: 478 K/uL — ABNORMAL HIGH (ref 150–400)
RBC: 3.88 MIL/uL (ref 3.87–5.11)
RDW: 12.7 % (ref 11.5–15.5)
WBC: 21.5 K/uL — ABNORMAL HIGH (ref 4.0–10.5)
nRBC: 0 % (ref 0.0–0.2)

## 2023-10-23 LAB — POC URINE PREG, ED: Preg Test, Ur: NEGATIVE

## 2023-10-23 LAB — LIPASE, BLOOD: Lipase: 30 U/L (ref 11–51)

## 2023-10-23 MED ORDER — ONDANSETRON HCL 4 MG/2ML IJ SOLN
4.0000 mg | Freq: Once | INTRAMUSCULAR | Status: DC
Start: 2023-10-23 — End: 2023-10-23
  Filled 2023-10-23: qty 2

## 2023-10-23 MED ORDER — PANTOPRAZOLE SODIUM 40 MG IV SOLR
40.0000 mg | Freq: Once | INTRAVENOUS | Status: AC
  Administered 2023-10-24: 40 mg via INTRAVENOUS
  Filled 2023-10-23 (×2): qty 10

## 2023-10-23 MED ORDER — HYDROMORPHONE HCL 1 MG/ML IJ SOLN
0.5000 mg | Freq: Once | INTRAMUSCULAR | Status: AC
  Administered 2023-10-23: 0.5 mg via INTRAVENOUS

## 2023-10-23 MED ORDER — IOHEXOL 350 MG/ML SOLN
100.0000 mL | Freq: Once | INTRAVENOUS | Status: AC | PRN
  Administered 2023-10-23: 100 mL via INTRAVENOUS

## 2023-10-23 MED ORDER — ONDANSETRON 4 MG PO TBDP
4.0000 mg | ORAL_TABLET | Freq: Once | ORAL | Status: AC | PRN
  Administered 2023-10-23: 4 mg via ORAL
  Filled 2023-10-23: qty 1

## 2023-10-23 MED ORDER — HYDROMORPHONE HCL 1 MG/ML IJ SOLN
0.5000 mg | Freq: Once | INTRAMUSCULAR | Status: DC
  Filled 2023-10-23: qty 0.5

## 2023-10-23 MED ORDER — SODIUM CHLORIDE 0.9 % IV BOLUS
1000.0000 mL | Freq: Once | INTRAVENOUS | Status: AC
Start: 2023-10-23 — End: 2023-10-24
  Administered 2023-10-23: 1000 mL via INTRAVENOUS

## 2023-10-23 MED ORDER — DICYCLOMINE HCL 20 MG PO TABS
20.0000 mg | ORAL_TABLET | Freq: Once | ORAL | Status: AC
  Administered 2023-10-24: 20 mg via ORAL
  Filled 2023-10-23 (×2): qty 1

## 2023-10-23 MED ORDER — MORPHINE SULFATE (PF) 4 MG/ML IV SOLN
4.0000 mg | Freq: Once | INTRAVENOUS | Status: AC
  Administered 2023-10-23: 4 mg via INTRAVENOUS
  Filled 2023-10-23: qty 1

## 2023-10-23 MED ORDER — DIPHENHYDRAMINE HCL 50 MG/ML IJ SOLN
25.0000 mg | Freq: Once | INTRAMUSCULAR | Status: AC
Start: 2023-10-23 — End: 2023-10-23
  Administered 2023-10-23: 25 mg via INTRAVENOUS
  Filled 2023-10-23: qty 1

## 2023-10-23 NOTE — ED Triage Notes (Signed)
 Patient ambulatory to triage with complaints of worsening lower abdominal/pelvic pain. Patient states she was seen yesterday and diagnosed with worsening menstrual cramps. Provided with naprosyn . She has taken that and ibuprofen today without relief. Patient is tearful in triage due to complaints of pain. Also states she vomited and feels nauseous.

## 2023-10-23 NOTE — ED Provider Notes (Signed)
 [facility] Provider Note    Event Date/Time   First MD Initiated Contact with Patient 10/23/23 2108     (approximate)   History   Abdominal Pain and Emesis   HPI  Mercedes Page is a 33 year old female presenting to the emergency department for evaluation of abdominal pain.  Patient reports that she had menstrual cramping yesterday, was seen in the ER and discharged with naproxen .  Since that time she has worsening generalized abdominal pain with associated nausea and vomiting.  Does not think she has had similar pain.  Reports rare marijuana use.     Physical Exam   Triage Vital Signs: ED Triage Vitals  Encounter Vitals Group     BP 10/23/23 2030 (!) 160/95     Girls Systolic BP Percentile --      Girls Diastolic BP Percentile --      Boys Systolic BP Percentile --      Boys Diastolic BP Percentile --      Pulse Rate 10/23/23 2030 92     Resp 10/23/23 2030 18     Temp 10/23/23 2030 98.2 F (36.8 C)     Temp Source 10/23/23 2030 Oral     SpO2 10/23/23 2030 100 %     Weight 10/23/23 2031 245 lb (111.1 kg)     Height 10/23/23 2031 5' 8 (1.727 m)     Head Circumference --      Peak Flow --      Pain Score 10/23/23 2031 10     Pain Loc --      Pain Education --      Exclude from Growth Chart --     Most recent vital signs: Vitals:   10/23/23 2030 10/23/23 2105  BP: (!) 160/95   Pulse: 92   Resp: 18   Temp: 98.2 F (36.8 C)   SpO2: 100% 99%     General: Awake, interactive  CV:  Good peripheral perfusion Resp:  Unlabored respirations Abd:  Nondistended, soft, mild generalized tenderness without rebound or guarding Neuro:  Symmetric facial movement, fluid speech   ED Results / Procedures / Treatments   Labs (all labs ordered are listed, but only abnormal results are displayed) Labs Reviewed  COMPREHENSIVE METABOLIC PANEL WITH GFR - Abnormal; Notable for the following components:      Result Value   Glucose, Bld 107 (*)     All other components within normal limits  CBC - Abnormal; Notable for the following components:   WBC 21.5 (*)    Hemoglobin 11.5 (*)    HCT 35.1 (*)    Platelets 478 (*)    All other components within normal limits  URINALYSIS, ROUTINE W REFLEX MICROSCOPIC - Abnormal; Notable for the following components:   Color, Urine RED (*)    APPearance TURBID (*)    Glucose, UA   (*)    Value: TEST NOT REPORTED DUE TO COLOR INTERFERENCE OF URINE PIGMENT   Hgb urine dipstick   (*)    Value: TEST NOT REPORTED DUE TO COLOR INTERFERENCE OF URINE PIGMENT   Bilirubin Urine   (*)    Value: TEST NOT REPORTED DUE TO COLOR INTERFERENCE OF URINE PIGMENT   Ketones, ur   (*)    Value: TEST NOT REPORTED DUE TO COLOR INTERFERENCE OF URINE PIGMENT   Protein, ur   (*)    Value: TEST NOT REPORTED DUE TO COLOR INTERFERENCE OF URINE PIGMENT   Nitrite   (*)  Value: TEST NOT REPORTED DUE TO COLOR INTERFERENCE OF URINE PIGMENT   Leukocytes,Ua   (*)    Value: TEST NOT REPORTED DUE TO COLOR INTERFERENCE OF URINE PIGMENT   All other components within normal limits  LIPASE, BLOOD  POC URINE PREG, ED     EKG EKG independently reviewed and interpreted by myself demonstrates:    RADIOLOGY Imaging independently reviewed and interpreted by myself demonstrates:  CT abdomen pelvis without acute abnormality  Formal Radiology Read:  CT ABDOMEN PELVIS W CONTRAST Result Date: 10/23/2023 CLINICAL DATA:  Worsening abdominal pain for 2 days with nausea and vomiting, subsequent encounter EXAM: CT ABDOMEN AND PELVIS WITH CONTRAST TECHNIQUE: Multidetector CT imaging of the abdomen and pelvis was performed using the standard protocol following bolus administration of intravenous contrast. RADIATION DOSE REDUCTION: This exam was performed according to the departmental dose-optimization program which includes automated exposure control, adjustment of the mA and/or kV according to patient size and/or use of iterative  reconstruction technique. CONTRAST:  OMNIPAQUE  IOHEXOL  350 MG/ML SOLN COMPARISON:  Ultrasound from the previous day. FINDINGS: Lower chest: No acute abnormality. Hepatobiliary: No focal liver abnormality is seen. No gallstones, gallbladder wall thickening, or biliary dilatation. Pancreas: Unremarkable. No pancreatic ductal dilatation or surrounding inflammatory changes. Spleen: Normal in size without focal abnormality. Adrenals/Urinary Tract: Adrenal glands are within normal limits. No renal calculi or obstructive changes are seen. Stomach/Bowel: No obstructive or inflammatory changes of the colon are noted. The appendix is air-filled and within normal limits. Small bowel and stomach are unremarkable. Vascular/Lymphatic: No significant vascular findings are present. No enlarged abdominal or pelvic lymph nodes. Reproductive: Uterus and bilateral adnexa are unremarkable. Other: No abdominal wall hernia or abnormality. No abdominopelvic ascites. Musculoskeletal: No acute or significant osseous findings. IMPRESSION: No acute abnormality to correspond with the given clinical history. Electronically Signed   By: Oneil Devonshire M.D.   On: 10/23/2023 22:50    PROCEDURES:  Critical Care performed: No  Procedures   MEDICATIONS ORDERED IN ED: Medications  pantoprazole  (PROTONIX ) injection 40 mg (has no administration in time range)  dicyclomine  (BENTYL ) tablet 20 mg (has no administration in time range)  ondansetron  (ZOFRAN -ODT) disintegrating tablet 4 mg (4 mg Oral Given 10/23/23 2033)  sodium chloride  0.9 % bolus 1,000 mL (1,000 mLs Intravenous New Bag/Given 10/23/23 2149)  morphine  (PF) 4 MG/ML injection 4 mg (4 mg Intravenous Given 10/23/23 2150)  diphenhydrAMINE  (BENADRYL ) injection 25 mg (25 mg Intravenous Given 10/23/23 2156)  iohexol  (OMNIPAQUE ) 350 MG/ML injection 100 mL (100 mLs Intravenous Contrast Given 10/23/23 2230)  HYDROmorphone  (DILAUDID ) injection 0.5 mg (0.5 mg Intravenous Given 10/23/23  2254)     IMPRESSION / MDM / ASSESSMENT AND PLAN / ED COURSE  I reviewed the triage vital signs and the nursing notes.  Differential diagnosis includes, but is not limited to, colitis, diverticulitis, appendicitis, other acute intra-abdominal process, abdominal pain related to recent menstrual cramps, will lower suspicion ovarian pathology given diffuse location of pain, consideration for cyclic vomiting or cannabinoid hyperemesis the patient reports limited marijuana use  Patient's presentation is most consistent with acute presentation with potential threat to life or bodily function.  33 year old female presenting to the emergency department for evaluation of abdominal pain, seen yesterday for pain in the setting of menstrual cramping, reports significantly worsened symptoms.  Labs with worsened leukocytosis with WBC of 21.5, though patient does appear to have some chronic leukocytosis.  Mildly worsened hemoglobin at 11.5, likely related to her vaginal bleeding.  CMP without significant  derangement.  UA limited due to significant presence of blood, no bacteria, overall lower suspicion UTI.  Of note, prior to my initial evaluation patient did receive oral Zofran  and on my evaluation was noted to have hives over her upper arms, no evidence of airway compromise or anaphylaxis.  Zofran  was listed on her allergy list. Given worsening leukocytosis and pain, CT was ordered to further evaluate which was without acute abnormality.  On reassessment, patient reports ongoing abdominal pain, has had continued episodes of vomiting.  Will trial additional medication, plan for p.o. trial.  If symptoms improved and tolerating p.o., suspect patient may be stable for discharge home.  Signed out to oncoming physician pending reassessment after medication.     FINAL CLINICAL IMPRESSION(S) / ED DIAGNOSES   Final diagnoses:  Generalized abdominal pain  Nausea and vomiting, unspecified vomiting type     Rx / DC  Orders   ED Discharge Orders     None        Note:  This document was prepared using Dragon voice recognition software and may include unintentional dictation errors.   Levander Slate, MD 10/23/23 [phone]

## 2023-10-23 NOTE — ED Provider Notes (Signed)
-----------------------------------------   11:22 PM on 10/23/2023 -----------------------------------------  Blood pressure (!) 160/95, pulse 92, temperature 98.2 F (36.8 C), temperature source Oral, resp. rate 18, height 5' 8 (1.727 m), weight 111.1 kg, last menstrual period 10/09/2023, SpO2 99%.  Assuming care from Dr. Levander.  In short, Courtnee Mcinnis is a 33 y.o. female with a chief complaint of Abdominal Pain and Emesis .  Refer to the original H&P for additional details.  The current plan of care is to reassess following additional medications for abdominal pain with nausea and vomiting.  ED ECG REPORT I, Carlin Palin, the attending physician, personally viewed and interpreted this ECG.   Date: 10/24/2023  EKG Time: 00:03  Rate: 68  Rhythm: normal sinus rhythm  Axis: Normal  Intervals:nonspecific intraventricular conduction delay  ST&T Change: None  ----------------------------------------- 2:31 AM on 10/24/2023 ----------------------------------------- Patient continued to have abdominal pain with nausea despite Bentyl  and PPI.  She was given IV droperidol  with improvement in symptoms, did complain of some restlessness that improved with IV Benadryl .  She is now tolerating oral intake and appropriate for discharge home with outpatient follow-up, was counseled to return to the ED for new or worsening symptoms.  Patient agrees with plan.      Palin Carlin, MD 10/24/23 [phone]

## 2023-10-24 MED ORDER — DROPERIDOL 2.5 MG/ML IJ SOLN
2.5000 mg | Freq: Once | INTRAMUSCULAR | Status: AC
  Administered 2023-10-24: 2.5 mg via INTRAVENOUS
  Filled 2023-10-24: qty 2

## 2023-10-24 MED ORDER — PROMETHAZINE HCL 12.5 MG PO TABS: 12.5000 mg | ORAL_TABLET | Freq: Four times a day (QID) | ORAL | 0 refills | Status: DC | PRN

## 2023-10-24 MED ORDER — PROMETHAZINE HCL 12.5 MG PO TABS: 12.5000 mg | ORAL_TABLET | Freq: Four times a day (QID) | ORAL | 0 refills | Status: AC | PRN

## 2023-10-24 MED ORDER — DIPHENHYDRAMINE HCL 50 MG/ML IJ SOLN
25.0000 mg | Freq: Once | INTRAMUSCULAR | Status: AC
  Administered 2023-10-24: 25 mg via INTRAVENOUS
  Filled 2023-10-24: qty 1
